# Patient Record
Sex: Male | Born: 1950 | Race: White | Hispanic: No | Marital: Married | Smoking: Never smoker
Health system: Southern US, Community
[De-identification: ages and names within clinical notes are randomized; demographics above are authoritative.]

## PROBLEM LIST (undated history)

## (undated) DIAGNOSIS — I10 Essential (primary) hypertension: Secondary | ICD-10-CM

## (undated) DIAGNOSIS — I739 Peripheral vascular disease, unspecified: Secondary | ICD-10-CM

## (undated) HISTORY — DX: Peripheral vascular disease, unspecified: I73.9

## (undated) HISTORY — PX: KNEE SURGERY: SHX244

---

## 1998-11-02 ENCOUNTER — Emergency Department (HOSPITAL_COMMUNITY): Admission: EM | Admit: 1998-11-02 | Discharge: 1998-11-02 | Payer: Self-pay | Admitting: Emergency Medicine

## 1998-11-14 ENCOUNTER — Emergency Department (HOSPITAL_COMMUNITY): Admission: EM | Admit: 1998-11-14 | Discharge: 1998-11-14 | Payer: Self-pay | Admitting: Emergency Medicine

## 1998-11-14 ENCOUNTER — Encounter: Payer: Self-pay | Admitting: Emergency Medicine

## 1999-06-16 ENCOUNTER — Encounter: Payer: Self-pay | Admitting: Emergency Medicine

## 1999-06-16 ENCOUNTER — Emergency Department (HOSPITAL_COMMUNITY): Admission: EM | Admit: 1999-06-16 | Discharge: 1999-06-16 | Payer: Self-pay | Admitting: Emergency Medicine

## 1999-06-23 ENCOUNTER — Emergency Department (HOSPITAL_COMMUNITY): Admission: EM | Admit: 1999-06-23 | Discharge: 1999-06-23 | Payer: Self-pay | Admitting: Internal Medicine

## 2000-01-02 ENCOUNTER — Emergency Department (HOSPITAL_COMMUNITY): Admission: EM | Admit: 2000-01-02 | Discharge: 2000-01-02 | Payer: Self-pay | Admitting: Emergency Medicine

## 2002-03-19 ENCOUNTER — Encounter: Payer: Self-pay | Admitting: Emergency Medicine

## 2002-03-19 ENCOUNTER — Emergency Department (HOSPITAL_COMMUNITY): Admission: EM | Admit: 2002-03-19 | Discharge: 2002-03-19 | Payer: Self-pay | Admitting: Emergency Medicine

## 2003-10-05 ENCOUNTER — Emergency Department (HOSPITAL_COMMUNITY): Admission: EM | Admit: 2003-10-05 | Discharge: 2003-10-05 | Payer: Self-pay | Admitting: Emergency Medicine

## 2003-10-09 ENCOUNTER — Emergency Department (HOSPITAL_COMMUNITY): Admission: AD | Admit: 2003-10-09 | Discharge: 2003-10-09 | Payer: Self-pay | Admitting: Family Medicine

## 2003-10-21 ENCOUNTER — Emergency Department (HOSPITAL_COMMUNITY): Admission: AD | Admit: 2003-10-21 | Discharge: 2003-10-21 | Payer: Self-pay | Admitting: Family Medicine

## 2004-02-21 ENCOUNTER — Emergency Department (HOSPITAL_COMMUNITY): Admission: EM | Admit: 2004-02-21 | Discharge: 2004-02-21 | Payer: Self-pay | Admitting: Internal Medicine

## 2004-06-27 ENCOUNTER — Emergency Department (HOSPITAL_COMMUNITY): Admission: EM | Admit: 2004-06-27 | Discharge: 2004-06-27 | Payer: Self-pay | Admitting: Emergency Medicine

## 2004-07-05 ENCOUNTER — Emergency Department (HOSPITAL_COMMUNITY): Admission: EM | Admit: 2004-07-05 | Discharge: 2004-07-05 | Payer: Self-pay | Admitting: Emergency Medicine

## 2004-11-22 ENCOUNTER — Emergency Department (HOSPITAL_COMMUNITY): Admission: EM | Admit: 2004-11-22 | Discharge: 2004-11-22 | Payer: Self-pay | Admitting: Emergency Medicine

## 2005-11-05 ENCOUNTER — Emergency Department (HOSPITAL_COMMUNITY): Admission: EM | Admit: 2005-11-05 | Discharge: 2005-11-05 | Payer: Self-pay | Admitting: Emergency Medicine

## 2011-06-02 ENCOUNTER — Emergency Department (HOSPITAL_COMMUNITY)
Admission: EM | Admit: 2011-06-02 | Discharge: 2011-06-02 | Discharge status: Against Medical Advice | Payer: Self-pay | Attending: Emergency Medicine | Admitting: Emergency Medicine

## 2011-06-02 DIAGNOSIS — Z79899 Other long term (current) drug therapy: Secondary | ICD-10-CM | POA: Insufficient documentation

## 2011-06-02 DIAGNOSIS — R29898 Other symptoms and signs involving the musculoskeletal system: Secondary | ICD-10-CM | POA: Insufficient documentation

## 2011-06-02 DIAGNOSIS — G8929 Other chronic pain: Secondary | ICD-10-CM | POA: Insufficient documentation

## 2011-06-02 DIAGNOSIS — M545 Low back pain, unspecified: Secondary | ICD-10-CM | POA: Insufficient documentation

## 2011-06-02 DIAGNOSIS — M79609 Pain in unspecified limb: Secondary | ICD-10-CM | POA: Insufficient documentation

## 2011-06-02 DIAGNOSIS — R209 Unspecified disturbances of skin sensation: Secondary | ICD-10-CM | POA: Insufficient documentation

## 2011-06-02 DIAGNOSIS — M5412 Radiculopathy, cervical region: Secondary | ICD-10-CM | POA: Insufficient documentation

## 2011-06-02 DIAGNOSIS — I10 Essential (primary) hypertension: Secondary | ICD-10-CM | POA: Insufficient documentation

## 2011-06-02 LAB — CBC
Hemoglobin: 15.2 g/dL (ref 13.0–17.0)
MCH: 32.7 pg (ref 26.0–34.0)
MCV: 91.8 fL (ref 78.0–100.0)
RBC: 4.65 MIL/uL (ref 4.22–5.81)

## 2011-06-02 LAB — DIFFERENTIAL
Lymphs Abs: 3.9 10*3/uL (ref 0.7–4.0)
Monocytes Relative: 7 % (ref 3–12)
Neutro Abs: 5.1 10*3/uL (ref 1.7–7.7)
Neutrophils Relative %: 52 % (ref 43–77)

## 2011-06-02 LAB — POCT I-STAT, CHEM 8
Chloride: 105 mEq/L (ref 96–112)
Creatinine, Ser: 0.8 mg/dL (ref 0.50–1.35)
Glucose, Bld: 110 mg/dL — ABNORMAL HIGH (ref 70–99)
Potassium: 3.6 mEq/L (ref 3.5–5.1)

## 2013-12-31 ENCOUNTER — Encounter (HOSPITAL_COMMUNITY): Payer: Self-pay | Admitting: Emergency Medicine

## 2013-12-31 ENCOUNTER — Emergency Department (HOSPITAL_COMMUNITY): Payer: BC Managed Care – PPO

## 2013-12-31 ENCOUNTER — Emergency Department (HOSPITAL_COMMUNITY)
Admission: EM | Admit: 2013-12-31 | Discharge: 2014-01-01 | Disposition: A | Discharge status: Home / Self Care | Payer: BC Managed Care – PPO | Attending: Emergency Medicine | Admitting: Emergency Medicine

## 2013-12-31 DIAGNOSIS — R269 Unspecified abnormalities of gait and mobility: Secondary | ICD-10-CM | POA: Insufficient documentation

## 2013-12-31 DIAGNOSIS — R4789 Other speech disturbances: Secondary | ICD-10-CM | POA: Insufficient documentation

## 2013-12-31 DIAGNOSIS — R4182 Altered mental status, unspecified: Secondary | ICD-10-CM | POA: Insufficient documentation

## 2013-12-31 DIAGNOSIS — N179 Acute kidney failure, unspecified: Secondary | ICD-10-CM | POA: Insufficient documentation

## 2013-12-31 LAB — CBC WITH DIFFERENTIAL/PLATELET
Basophils Absolute: 0 10*3/uL (ref 0.0–0.1)
Basophils Relative: 0 % (ref 0–1)
EOS ABS: 0.1 10*3/uL (ref 0.0–0.7)
Eosinophils Relative: 1 % (ref 0–5)
HCT: 33.5 % — ABNORMAL LOW (ref 39.0–52.0)
HEMOGLOBIN: 11.6 g/dL — AB (ref 13.0–17.0)
LYMPHS ABS: 1.2 10*3/uL (ref 0.7–4.0)
Lymphocytes Relative: 13 % (ref 12–46)
MCH: 34 pg (ref 26.0–34.0)
MCHC: 34.6 g/dL (ref 30.0–36.0)
MCV: 98.2 fL (ref 78.0–100.0)
MONOS PCT: 9 % (ref 3–12)
Monocytes Absolute: 0.9 10*3/uL (ref 0.1–1.0)
NEUTROS ABS: 7.3 10*3/uL (ref 1.7–7.7)
NEUTROS PCT: 77 % (ref 43–77)
PLATELETS: 235 10*3/uL (ref 150–400)
RBC: 3.41 MIL/uL — AB (ref 4.22–5.81)
RDW: 12.8 % (ref 11.5–15.5)
WBC: 9.5 10*3/uL (ref 4.0–10.5)

## 2013-12-31 LAB — COMPREHENSIVE METABOLIC PANEL
ALBUMIN: 3.7 g/dL (ref 3.5–5.2)
ALK PHOS: 58 U/L (ref 39–117)
ALT: 88 U/L — AB (ref 0–53)
AST: 285 U/L — ABNORMAL HIGH (ref 0–37)
BILIRUBIN TOTAL: 0.4 mg/dL (ref 0.3–1.2)
BUN: 21 mg/dL (ref 6–23)
CHLORIDE: 102 meq/L (ref 96–112)
CO2: 26 mEq/L (ref 19–32)
Calcium: 9 mg/dL (ref 8.4–10.5)
Creatinine, Ser: 1.5 mg/dL — ABNORMAL HIGH (ref 0.50–1.35)
GFR calc non Af Amer: 48 mL/min — ABNORMAL LOW (ref >90)
GFR, EST AFRICAN AMERICAN: 56 mL/min — AB (ref >90)
GLUCOSE: 105 mg/dL — AB (ref 70–99)
POTASSIUM: 4.4 meq/L (ref 3.7–5.3)
SODIUM: 143 meq/L (ref 137–147)
TOTAL PROTEIN: 7.2 g/dL (ref 6.0–8.3)

## 2013-12-31 LAB — ETHANOL

## 2013-12-31 LAB — I-STAT CG4 LACTIC ACID, ED: LACTIC ACID, VENOUS: 2.36 mmol/L — AB (ref 0.5–2.2)

## 2013-12-31 LAB — I-STAT TROPONIN, ED: Troponin i, poc: 0.01 ng/mL (ref 0.00–0.08)

## 2013-12-31 LAB — AMMONIA: AMMONIA: 39 umol/L (ref 11–60)

## 2013-12-31 MED ORDER — SODIUM CHLORIDE 0.9 % IV BOLUS (SEPSIS)
1000.0000 mL | Freq: Once | INTRAVENOUS | Status: AC
  Administered 2014-01-01: 1000 mL via INTRAVENOUS

## 2013-12-31 NOTE — ED Provider Notes (Signed)
CSN: [id number]     Arrival date & time 12/31/13  2157 History   First MD Initiated Contact with Patient 12/31/13 2220     Chief Complaint  Patient presents with  . Back Pain     (Consider location/radiation/quality/duration/timing/severity/associated sxs/prior Treatment) HPI Comments: Patient is a 63 year old male with history of low back pain who presents today from the EMS. EMS reports that they were initially called out for back pain, but when they arrived the patient had no complaints. He could not answer complex questions. He was oriented to person a date, but not to place. He was pale and diaphoretic upon arrival. For me he states that he feels fine. He reports that he was feeling "funny" when he was brought in, but now feels better. He reports that he is here because "he didn't pay his hotel bill".  He thinks he is feeling better because he is in a hospital. He believes he is in [employer]. He is oriented to person, but not to situation, time, or place. He is able to tell me who the President of the [place] is. He continues to repeat he takes Norco for pain, but did not take any prior to arrival. He is unable to give me any useful information.  On reevaluation of the patient he is able to tell me that EMS was called because he was having trouble sitting up. The "people at the hotel" called EMS because they "didn't want someone has to pick him up at 3 AM". He is now oriented to person, place, and situation.   The history is provided by the patient. No language interpreter was used.    History reviewed. No pertinent past medical history. History reviewed. No pertinent past surgical history. No family history on file. History  Substance Use Topics  . Smoking status: Never Smoker   . Smokeless tobacco: Never Used  . Alcohol Use: No     Comment: Denies    Review of Systems  Unable to perform ROS: Mental status change      Allergies  Review of patient's allergies indicates  no known allergies.  Home Medications   Current Outpatient Rx  Name  Route  Sig  Dispense  Refill  . HYDROcodone-acetaminophen (NORCO) 10-325 MG per tablet   Oral   Take 1 tablet by mouth every 6 (six) hours as needed for moderate pain.          BP 105/83  Pulse 92  Temp(Src) 98.7 F (37.1 C) (Oral)  Resp 16  SpO2 97% Physical Exam  Nursing note and vitals reviewed. Constitutional: He appears well-developed and well-nourished. No distress.  Disheveled.   HENT:  Head: Normocephalic and atraumatic.  Right Ear: External ear normal.  Left Ear: External ear normal.  Nose: Nose normal.  Eyes: Conjunctivae and EOM are normal. Pupils are equal, round, and reactive to light.  Pinpoint pupils, but equal and reactive.   Neck: Normal range of motion. No tracheal deviation present.  No nuchal rigidity or meningeal signs  Cardiovascular: Normal rate, regular rhythm, normal heart sounds, intact distal pulses and normal pulses.   Capillary refill < 3 seconds in all toes.   Pulmonary/Chest: Effort normal and breath sounds normal. No stridor.  Abdominal: Soft. He exhibits no distension. There is no tenderness.  Musculoskeletal: Normal range of motion.  Moves all 5 extremities  Neurological: He is alert. He has normal strength. Coordination normal.  Patient is oriented to person. Grip strength 5/5 bilaterally. Strength in  all extremities 5/5 bilaterally. Gait is shuffled. Finger nose finger normal.   Skin: Skin is warm and dry. He is not diaphoretic.  Psychiatric: He has a normal mood and affect. His speech is slurred. He is slowed.    ED Course  Procedures (including critical care time) Labs Review Labs Reviewed  CBC WITH DIFFERENTIAL - Abnormal; Notable for the following:    RBC 3.41 (*)    Hemoglobin 11.6 (*)    HCT 33.5 (*)    All other components within normal limits  COMPREHENSIVE METABOLIC PANEL - Abnormal; Notable for the following:    Glucose, Bld 105 (*)    Creatinine,  Ser 1.50 (*)    AST 285 (*)    ALT 88 (*)    GFR calc non Af Amer 48 (*)    GFR calc Af Amer 56 (*)    All other components within normal limits  URINALYSIS, ROUTINE W REFLEX MICROSCOPIC - Abnormal; Notable for the following:    APPearance CLOUDY (*)    Hgb urine dipstick LARGE (*)    Bilirubin Urine SMALL (*)    Ketones, ur 15 (*)    Protein, ur 100 (*)    All other components within normal limits  URINE RAPID DRUG SCREEN (HOSP PERFORMED) - Abnormal; Notable for the following:    Opiates POSITIVE (*)    Benzodiazepines POSITIVE (*)    All other components within normal limits  URINE MICROSCOPIC-ADD ON - Abnormal; Notable for the following:    Squamous Epithelial / LPF FEW (*)    Casts GRANULAR CAST (*)    All other components within normal limits  I-STAT CG4 LACTIC ACID, ED - Abnormal; Notable for the following:    Lactic Acid, Venous 2.36 (*)    All other components within normal limits  URINE CULTURE  ETHANOL  AMMONIA  I-STAT TROPOININ, ED   Imaging Review Ct Head Wo Contrast  01/01/2014   CLINICAL DATA:  Altered mental status  EXAM: CT HEAD WITHOUT CONTRAST  TECHNIQUE: Contiguous axial images were obtained from the base of the skull through the vertex without intravenous contrast.  COMPARISON:  None.  FINDINGS: Negative for acute intracranial hemorrhage, acute infarction, mass, mass effect, hydrocephalus or midline shift. Gray-white differentiation is preserved throughout. No acute soft tissue or calvarial abnormality. The globes and orbits are symmetric and unremarkable. Normal aeration of the mastoid air cells and visualized paranasal sinuses.  IMPRESSION: Negative head CT.   Electronically Signed   By: Malachy MoanHeath  McCullough M.D.   On: 01/01/2014 00:31   Dg Chest Port 1 View  12/31/2013   CLINICAL DATA:  SOB  EXAM: PORTABLE CHEST - 1 VIEW  COMPARISON:  None.  FINDINGS: Low lung volumes. Cardiac silhouette within the upper limits normal. Increased density left lung base. Blunting  left costophrenic angle. Osseous structures unremarkable.  IMPRESSION: Atelectasis versus infiltrate left lung base small and possible small left effusion.   Electronically Signed   By: Salome HolmesHector  Cooper M.D.   On: 12/31/2013 23:03     EKG Interpretation   Date/Time:  Wednesday December 31 2013 22:30:10 EDT Ventricular Rate:  104 PR Interval:  124 QRS Duration: 89 QT Interval:  321 QTC Calculation: 422 R Axis:   42 Text Interpretation:  Sinus tachycardia Low voltage, precordial leads No  significant change was found Confirmed by [facility] [email or web address]  MD, TREY ([id number]) on  01/01/2014 12:06:29 AM      MDM   Final diagnoses:  Altered mental status  Acute renal  failure   Patient presents to ED from a hotel by EMS. Initial complaint was back pain, but patient appeared altered initially. AMS work up was done which shows mild renal failure and a possible infiltrate of CXR. Patient's UDS was positive for opiates and benzos. Patient improved significantly through the duration of his ED stay. I felt as though his speech continued to be slurred and his gait continued to be shuffled. I was planning on admitting patient for further workup. The patient adamantly refused. He was oriented to person, place, and situation. He is competent at this point to make his own decisions and left AMA. Risks of doing so were discussed including worsening morbidity and death. He was encouraged to return to the ED if he changed his mind. Dr. Judd Lien evaluated patient and agrees with plan. Patient / Family / Caregiver informed of clinical course, understand medical decision-making process, and agree with plan.   Mora Bellman, PA-C 01/02/14 1159

## 2013-12-31 NOTE — ED Notes (Signed)
Assisted patient with urination attempt. Patient was not able to urinate for urinalysis.

## 2013-12-31 NOTE — ED Notes (Signed)
EMS called out for back pain, however upon arrival, patient had no complaints and could not answer complex questions. Pt is alert to name and date, but did not know where he was. EMs report patient was pale and diaphoretic. Pt states he took 2 norco for back spasms. Denies any ETOH or drugs. Pupils are pinpoint. CBG 157. HR 95 and BP 102/64. Patient has no complaints at this time.

## 2013-12-31 NOTE — ED Notes (Signed)
Patient 89% on RA patient place on 2L via [place]

## 2014-01-01 ENCOUNTER — Emergency Department (HOSPITAL_COMMUNITY): Payer: BC Managed Care – PPO

## 2014-01-01 LAB — RAPID URINE DRUG SCREEN, HOSP PERFORMED
Amphetamines: NOT DETECTED
BARBITURATES: NOT DETECTED
Benzodiazepines: POSITIVE — AB
Cocaine: NOT DETECTED
Opiates: POSITIVE — AB
Tetrahydrocannabinol: NOT DETECTED

## 2014-01-01 LAB — URINE MICROSCOPIC-ADD ON

## 2014-01-01 LAB — URINALYSIS, ROUTINE W REFLEX MICROSCOPIC
Glucose, UA: NEGATIVE mg/dL
KETONES UR: 15 mg/dL — AB
LEUKOCYTES UA: NEGATIVE
NITRITE: NEGATIVE
PROTEIN: 100 mg/dL — AB
Specific Gravity, Urine: 1.029 (ref 1.005–1.030)
UROBILINOGEN UA: 0.2 mg/dL (ref 0.0–1.0)
pH: 5 (ref 5.0–8.0)

## 2014-01-01 NOTE — Discharge Instructions (Signed)
Today you were seen at the Emergency Department. It is unclear what happened that brought you in to the hospital, but your blood work today is showing that you have some kidney failure and elevated liver enzymes. You left before your workup was complete. Risks of this include worsening quality of life, infection, permanent disability, and death. Please return to the ED if you change your mind.    Altered Mental Status Altered mental status most often refers to an abnormal change in your responsiveness and awareness. It can affect your speech, thought, mobility, memory, attention span, or alertness. It can range from slight confusion to complete unresponsiveness (coma). Altered mental status can be a sign of a serious underlying medical condition. Rapid evaluation and medical treatment is necessary for patients having an altered mental status. CAUSES   Low blood sugar (hypoglycemia) or diabetes.  Severe loss of body fluids (dehydration) or a body salt (electrolyte) imbalance.  A stroke or other neurologic problem, such as dementia or delirium.  A head injury or tumor.  A drug or alcohol overdose.  Exposure to toxins or poisons.  Depression, anxiety, and stress.  A low oxygen level (hypoxia).  An infection.  Blood loss.  Twitching or shaking (seizure).  Heart problems, such as heart attack or heart rhythm problems (arrhythmias).  A body temperature that is too low or too high (hypothermia or hyperthermia). DIAGNOSIS  A diagnosis is based on your history, symptoms, physical and neurologic examinations, and diagnostic tests. Diagnostic tests may include:  Measurement of your blood pressure, pulse, breathing, and oxygen levels (vital signs).  Blood tests.  Urine tests.  X-ray exams.  A computerized magnetic scan (magnetic resonance imaging, MRI).  A computerized X-ray scan (computed tomography, CT scan). TREATMENT  Treatment will depend on the cause. Treatment may  include:  Management of an underlying medical or mental health condition.  Critical care or support in the hospital. HOME CARE INSTRUCTIONS   Only take over-the-counter or prescription medicines for pain, discomfort, or fever as directed by your caregiver.  Manage underlying conditions as directed by your caregiver.  Eat a healthy, well-balanced diet to maintain strength.  Join a support group or prevention program to cope with the condition or trauma that caused the altered mental status. Ask your caregiver to help choose a program that works for you.  Follow up with your caregiver for further examination, therapy, or testing as directed. SEEK MEDICAL CARE IF:   You feel unwell or have chills.  You or your family notice a change in your behavior or your alertness.  You have trouble following your caregiver's treatment plan.  You have questions or concerns. SEEK IMMEDIATE MEDICAL CARE IF:   You have a rapid heartbeat or have chest pain.  You have difficulty breathing.  You have a fever.  You have a headache with a stiff neck.  You cough up blood.  You have blood in your urine or stool.  You have severe agitation or confusion. MAKE SURE YOU:   Understand these instructions.  Will watch your condition.  Will get help right away if you are not doing well or get worse. Document Released: 03/08/2010 Document Revised: 12/11/2011 Document Reviewed: 03/08/2010 [facility] Patient Information 2014 [place], [place].   Kidney Failure Kidney failure happens when the kidneys cannot remove waste and excess fluid that naturally builds up in your blood after your body breaks down food. This leads to a dangerous buildup of waste products and fluid in the blood. HOME CARE  Follow your diet as told by your doctor.  Take all medicines as told by your doctor.  Keep all of your dialysis appointments. Call if you are unable to keep an appointment. GET HELP RIGHT AWAY IF:   You make  a lot more or very little pee (urine).  Your face or ankles puff up (swell).  You develop shortness of breath.  You develop weakness, feel tired, or you do not feel hungry (appetite loss).  You feel poorly for no known reason. MAKE SURE YOU:   Understand these instructions.  Will watch your condition.  Will get help right away if you are not doing well or get worse. Document Released: 12/13/2009 Document Revised: 12/11/2011 Document Reviewed: 01/19/2010 [facility] Patient Information 2014 [place].   Emergency Department Resource Guide 1) Find a Doctor and Pay Out of Pocket Although you won't have to find out who is covered by your insurance plan, it is a good idea to ask around and get recommendations. You will then need to call the office and see if the doctor you have chosen will accept you as a new patient and what types of options they offer for patients who are self-pay. Some doctors offer discounts or will set up payment plans for their patients who do not have insurance, but you will need to ask so you aren't surprised when you get to your appointment.  2) Contact Your Local Health Department Not all health departments have doctors that can see patients for sick visits, but many do, so it is worth a call to see if yours does. If you don't know where your local health department is, you can check in your phone book. The CDC also has a tool to help you locate your state's health department, and many state websites also have listings of all of their local health departments.  3) Find a Walk-in Clinic If your illness is not likely to be very severe or complicated, you may want to try a walk in clinic. These are popping up all over the country in pharmacies, drugstores, and shopping centers. They're usually staffed by nurse practitioners or physician assistants that have been trained to treat common illnesses and complaints. They're usually fairly quick and inexpensive. However,  if you have serious medical issues or chronic medical problems, these are probably not your best option.  No Primary Care Doctor: - Call Health Connect at  [phone] - they can help you locate a primary care doctor that  accepts your insurance, provides certain services, etc. - Physician Referral Service- [phone]  Chronic Pain Problems: Organization         Address  Phone   Notes  [facility]  [phone] Patients need to be referred by their primary care doctor.   Medication Assistance: Organization         Address  Phone   Notes  [facility] Medication [facility] [address] [place], [place] [phone] --Must be a resident of [place] -- Must have NO insurance coverage whatsoever (no Medicaid/ Medicare, etc.) -- The pt. MUST have a primary care doctor that directs their care regularly and follows them in the community   Med[facility]ist  [phone]   [employer]  [phone]    Agencies that provide inexpensive medical care: Organization         Address  Phone   Notes  [facility]  [phone]   [facility]    [phone]   [facility], [place] [address] [phone]   [place] [address] [place] [phone]   Planned Parenthood    [phone]   [facility]    [phone]   [place] [address], [place] Phone:  [phone], Fax:  [phone] Hours of Operation:  9 am - 6 pm, M-F.  Also accepts Medicaid/Medicare and self-pay.  [facility] for [place], [address], [place] Phone: [phone], Fax: [phone]. Hours of Operation:  8:30 am - 5:30 pm, M-F.  Also accepts Medicaid and self-pay.  [facility] [place] [address], [place] Phone: [phone]   [place], [place], [place] [phone], Ext. [phone] Mondays & Thursdays: 7-9 AM.  First 15 patients are seen on a first come, first serve basis.    [place]ie Providers:  Organization         Address  Phone   Notes  [facility] [address], [place] [phone] Also accepts self-pay patients.  [facility] [address], [place]  [phone]   [place], [address], [place] [phone]   [facility] [address] [place] [phone]   Lucianne Lei [address], [place]   [phone] Only accepts [place] Access [place] patients after they have their name applied to their card.   Self-Pay (no insurance) in [facility]:  Organization         Address  Phone   Notes  Sickle Cell Patients, [facility] [place] [phone]   [facility] [place] [phone]   [facility] [place]  [place], [place], [place] [phone]   [facility]/Dr. Osei-Bonsu  [address] [place] or [address], [place] [phone] Phone number for both [place] and Trail locations is the same.  [facility] [address], [place] [phone]   [facility] [address], [place] or [address] [phone]   [facility] [address] [place] [phone], phone; [phone], fax Sees patients 1st and 3rd Saturday of every month.  Must not qualify for public or private insurance (i.e. Medicaid, Medicare, [place] Health Choice, Veterans' Benefits)  Household income should be no more than 200% of the poverty level The clinic cannot treat you if you are pregnant or think you are  pregnant  Sexually transmitted diseases are not treated at the clinic.    Dental Care: Organization         Address  Phone  Notes  [facility] Department of [facility] [place] [phone] Accepts children up to age 1 who are enrolled in [place] or [place]; pregnant women with a Medicaid card; and children who have applied for Medicaid or [place] Health Choice, but were declined, whose parents can pay a reduced fee at time of service.  [facility] of [facility]  [address], [employer] (  [phone] Accepts children up to age 23 who are enrolled in Medicaid or [place]; pregnant women with a Medicaid card; and children who have applied for Medicaid or [place] Health Choice, but were declined, whose parents can pay a reduced fee at time of service.  [facility]  [place] [phone] Patients are seen by appointment only. Walk-ins are not accepted. [place] will see patients 59 years of age and older. Monday - Tuesday (8am-5pm) Most Wednesdays (8:30-5pm) $30 per visit, cash only  [facility] Adult Dental Access PROGRAM  [address], [facility] [phone] Patients are seen by appointment only. Walk-ins are not accepted. [place] will see patients 65 years of age and older. One Wednesday Evening (Monthly: Volunteer Based).  $30 per visit, cash only  [facility]  [phone] for adults; Children under age 42, call Graduate Pediatric Dentistry at [phone]. Children aged 10-14, please call [phone] to request a pediatric application.  Dental services are provided in all areas of dental care including fillings, crowns and bridges, complete and partial dentures, implants, gum treatment, root canals, and extractions. Preventive care is also provided. Treatment is provided to both adults and  children. Patients are selected via a lottery and there is often a waiting list.   [facility] [address] [place]  [phone] [email or web address]   [facility] [address] [place], [place] [phone], Ext. [phone] Second and Fourth Thursday of each month, opens at 6:30 AM; Clinic ends at 9 AM.  Patients are seen on a first-come first-served basis, and a limited number are seen during each clinic.   [facility]  [address] [place]er [place], [place] [phone]   Eligibility Requirements You must have lived in [place], [place], or [place] counties for at least the last three months.   You cannot be eligible for state or federal sponsored [employer], including [employer], [place], or [employer].   You generally cannot be eligible for healthcare insurance through your employer.    How to apply: Eligibility screenings are held every Tuesday and Wednesday afternoon from 1:00 pm until 4:00 pm. You do not need an appointment for the interview!  [facility] [address] [place], [place]   [place]  [place] Department  [place]  [phone]    Behavioral Health Resources in the Community: Intensive Outpatient Programs Organization         Address  Phone  Notes  [facility] [place] [facility] [address] [place], [place] [phone]   [facility] Outpatient [address], [place], [place]   [facility]: [facility] [address] [place], [place]   [place] [address],  [place], [place] or [phone]   Substance Abuse Resources Organization         Address  Phone  Notes  [facility]  [phone]   [place]  [phone]   [facility] [place]    Chinita Pester  (714) [phone]   Residential & Outpatient Substance Abuse Program  [phone]   Psychological Services Organization         Address  Phone  Notes  [place]  [phone]  [facility] [address], [place] 1-800-853-5163 or [phone]   ° °Mobile Crisis Teams °Organization         Address  Phone  Notes  °[facility], Mobile Crisis Care Unit  1-877-626-1772   °Assertive °Psychotherapeutic Services ° [address] [place], [place] [phone]   °Sharon DeEsch [address] °[place] [phone]   ° °Self-Help/Support Groups °Organization         Address  Phone             Notes  °[facility] [place] - variety of support groups  336- [phone] Call for more information  °Narcotics Anonymous (NA), [facility] [address], °[place]  2 meetings at this location  ° °Residential Treatment Programs °Organization         Address  Phone  Notes  °[facility] [address],    °[place]  1-866-801-8205   °[facility] ° [address], [place], [place] [phone]   °[facility] [address], [place] [phone] Admissions: 8am-3pm M-F  °[facility] [address]    °[place], [place] [phone]   °[facility] [address], [place], [place] [phone]   °[facility] [address]  °[place], [place] [phone]   °[facility] - Intensive Outpatient [address], [place], [place] [phone]   °[facility] ([facility] [address]  °[place], [place] 1-877-615-2722 or 336-784-9470   °[facility] ([facility]) [address] [place], [place] 336-227-7417 Accepts Medicaid  °[facility] [address]  °[place] 1-800-659-3381 Substance Abuse/Addiction Treatment  ° °[place]  Behavioral Health Resources °Organization         Address  Phone  Notes  °[facility]  (888) 581-9988   °Julie Brannon, PhD [address] [place], [place]   (336) 349-5553 or (336) 951-0000   ° Behavioral   [address] °[place], [place] (336) 349-4454   °[facility] [address], [place], [place] (336) [phone] Insurance/Medicaid/sponsorship through [facility]  °[facility] [address]                                    [place], [place] (336) [phone] Therapy/tele-psych/case  °[facility] [address]  ° [place], [place] (336) [phone]    °Dr. Arfeen  (336) [phone]   °[facility] [place]  [employer] [place] [facility] 1) [address], [place] °2) [address], [place] °3)  [address], [place] (336) 349-3220 °(336) 342-7768 ° °(336) [phone]   °[place] Child Abuse Hotline (336) 342-1394 or (336) 342-3537 (After Hours)    ° ° ° °

## 2014-01-01 NOTE — ED Notes (Signed)
RN communicated risks of leaving AMA to patient. Patient leaving in cab. Patient was cleared to leave by MD [facility]

## 2014-01-01 NOTE — ED Notes (Signed)
Patient pulled Iv out and got dressed and was walking out. RN stopped patient and explained risks of leaving AMA. Pt communicated understanding and wanted to leave. RN had concerns about patients mental status. RN spoke to Applied MaterialsPA-C Hannah Merrel about patients neuro status. Patient is alert and answering questions appropriately.

## 2014-01-01 NOTE — ED Notes (Signed)
MD at bedside. 

## 2014-01-01 NOTE — ED Notes (Signed)
Per MD Delo, patient is clear to leave.

## 2014-01-01 NOTE — ED Notes (Signed)
RN walked out to waiting room with patient. RN still had concerns about patient's mental status. RN asked patient to write his name and number, patient wrote illegible words and " [phone] ". RN called number listed in patients chart and got own patients voicemail, and patient sounded drastically different on voicemail. RN convinced patient to come back to his room and have MD evaluate patient.

## 2014-01-02 LAB — URINE CULTURE
COLONY COUNT: NO GROWTH
CULTURE: NO GROWTH

## 2014-01-05 NOTE — ED Provider Notes (Signed)
Medical screening examination/treatment/procedure(s) were performed by non-physician practitioner and as supervising physician I was immediately available for consultation/collaboration.   EKG Interpretation   Date/Time:  Wednesday December 31 2013 22:30:10 EDT Ventricular Rate:  104 PR Interval:  124 QRS Duration: 89 QT Interval:  321 QTC Calculation: 422 R Axis:   42 Text Interpretation:  Sinus tachycardia Low voltage, precordial leads No  significant change was found Confirmed by [facility]  MD, TREY ([id number]) on  01/01/2014 12:06:29 AM       Geoffery Lyonsouglas Eshika Reckart, MD 01/05/14 2312

## 2014-11-13 IMAGING — CT CT HEAD W/O CM
1 series · 16 of 30 positions shown, 20 images · non-contrast
Comparison: None.

CLINICAL DATA: Altered mental status

EXAM:
CT HEAD WITHOUT CONTRAST
TECHNIQUE: Contiguous axial images were obtained from the base of the skull
through the vertex without intravenous contrast.

[Series 2: head 5.0 h30s · axial · 0.48mm/px · z∈[-185,-30]mm · 16 of 35 slices shown, 20 images]
[im 2/35  brain]
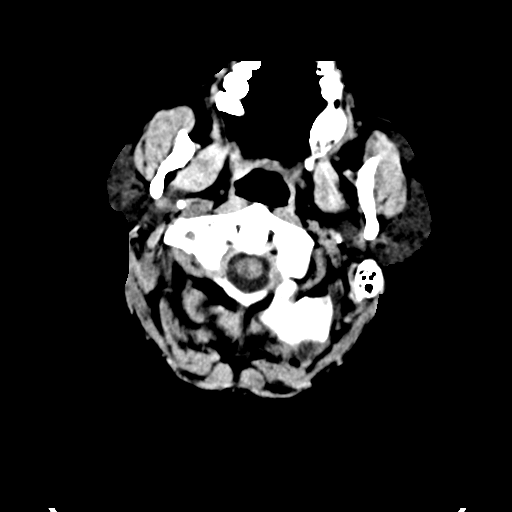
[im 2/35  bone]
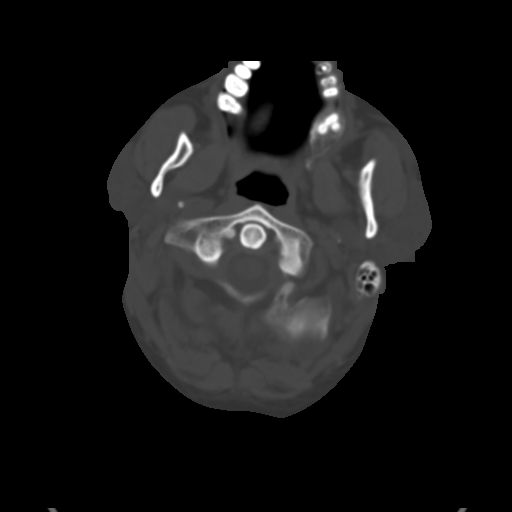
[im 4/35  brain]
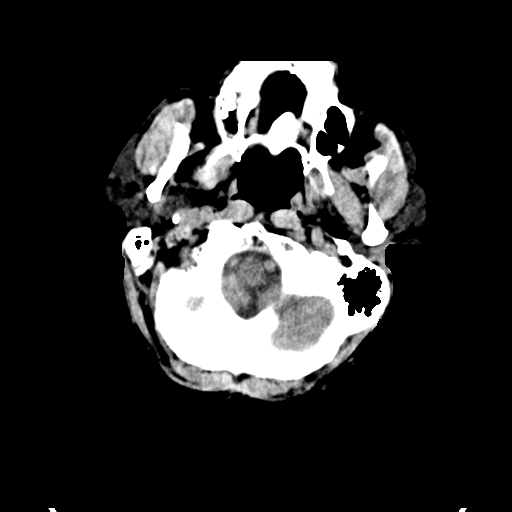
[im 6/35  brain]
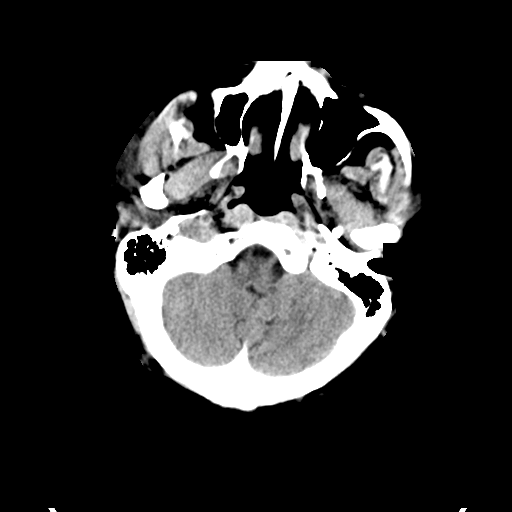
[im 9/35  brain]
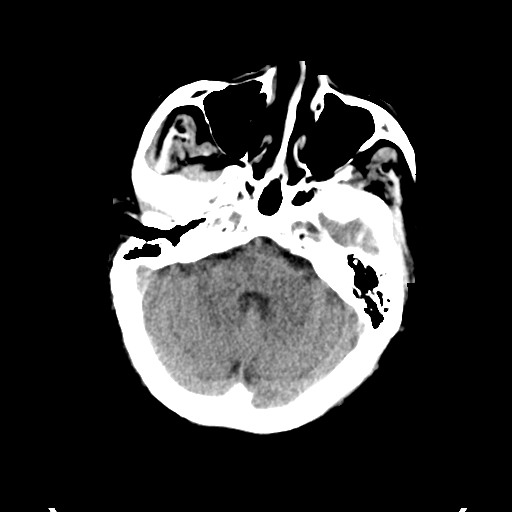
[im 10/35  brain]
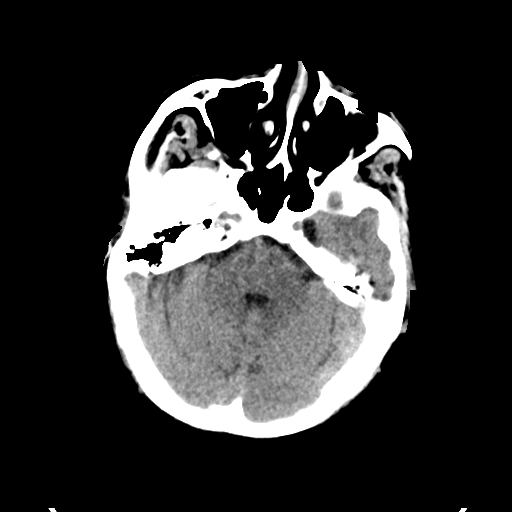
[im 10/35  bone]
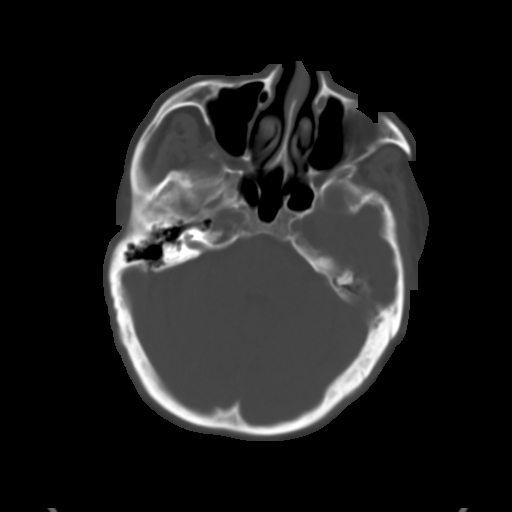
[im 12/35  brain]
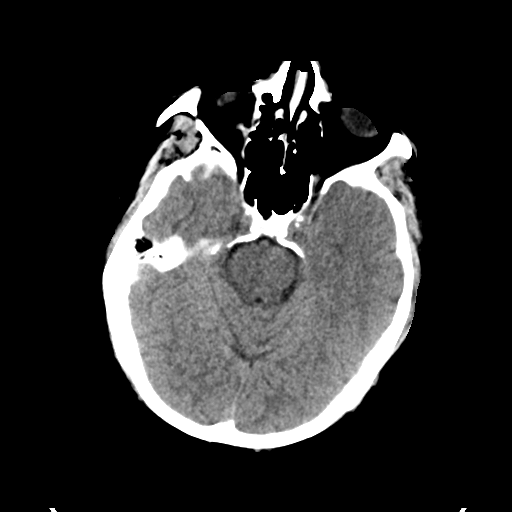
[im 15/35  brain]
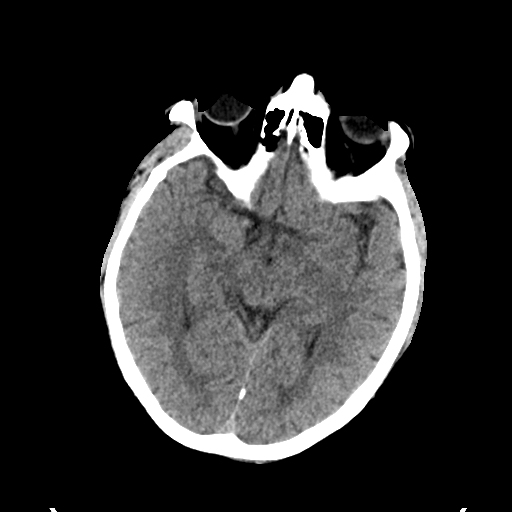
[im 17/35  brain]
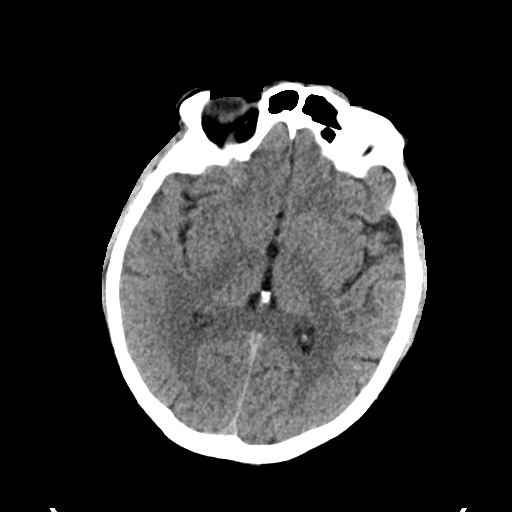
[im 18/35  brain]
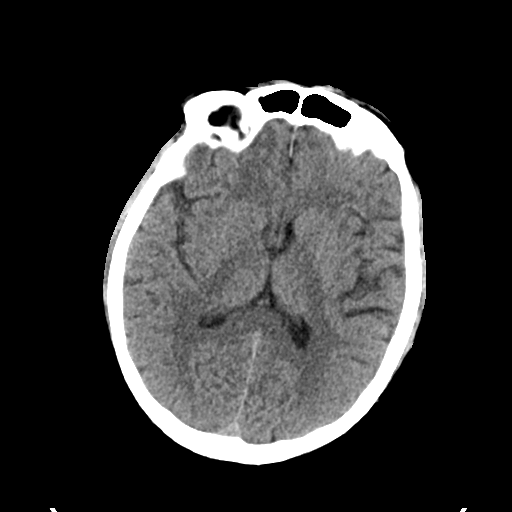
[im 18/35  bone]
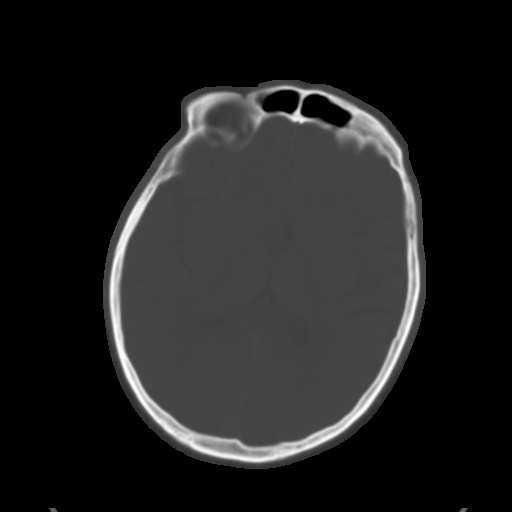
[im 20/35  brain]
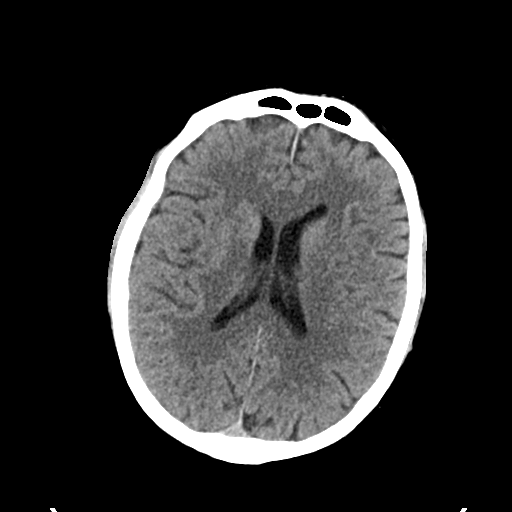
[im 23/35  brain]
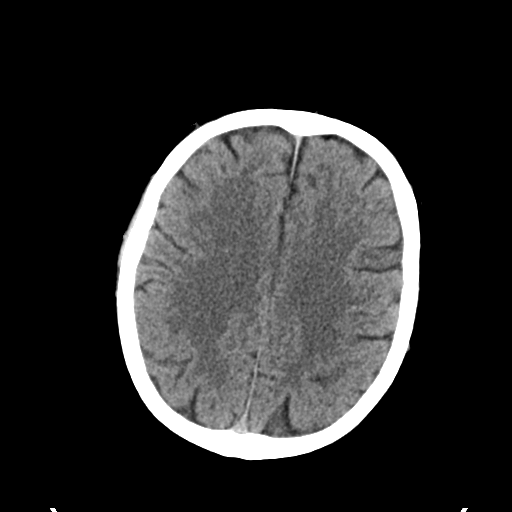
[im 25/35  brain]
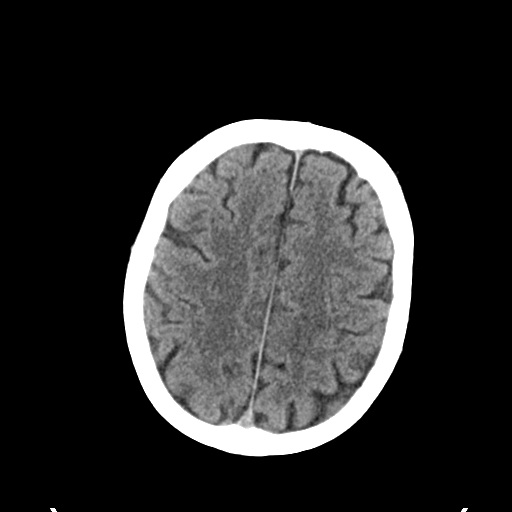
[im 26/35  brain]
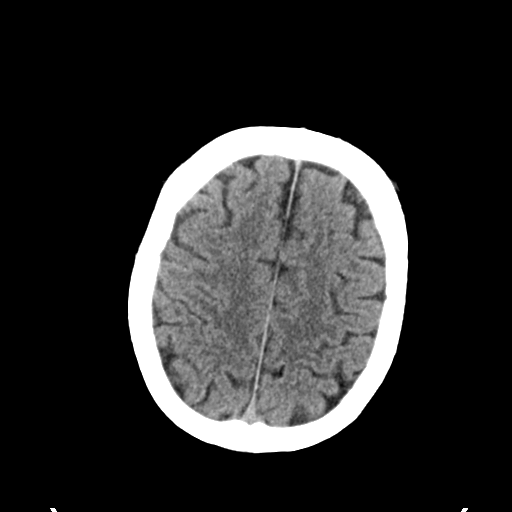
[im 26/35  bone]
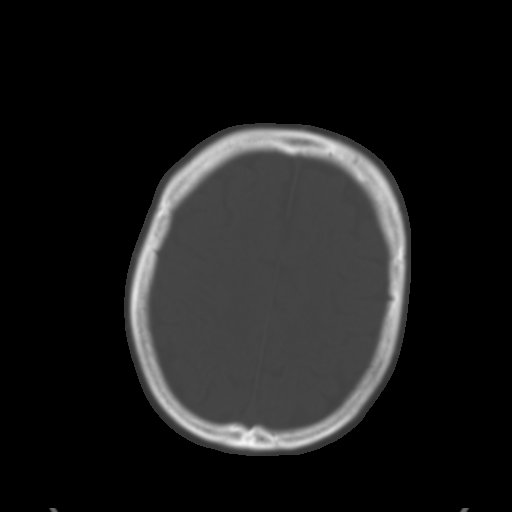
[im 29/35  brain]
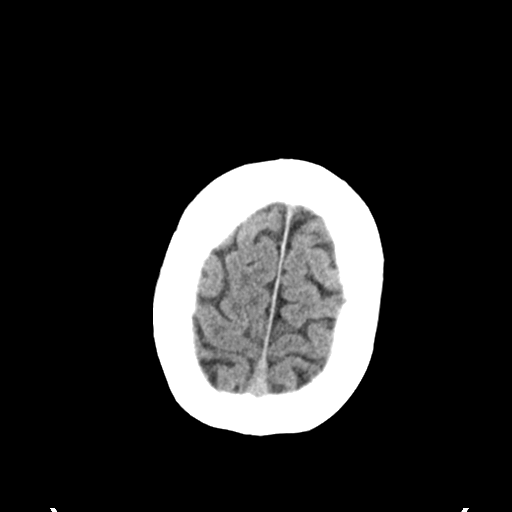
[im 31/35  brain]
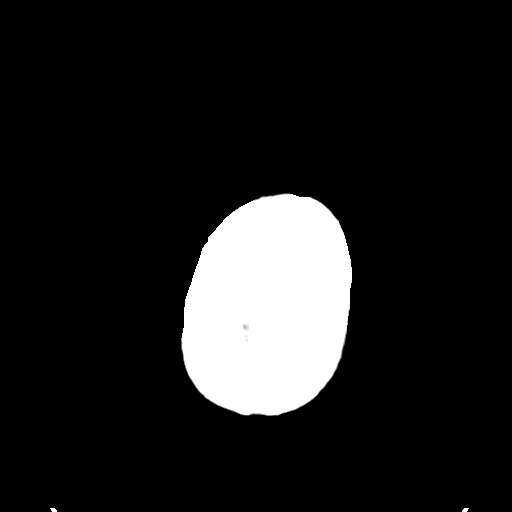
[im 33/35  brain]
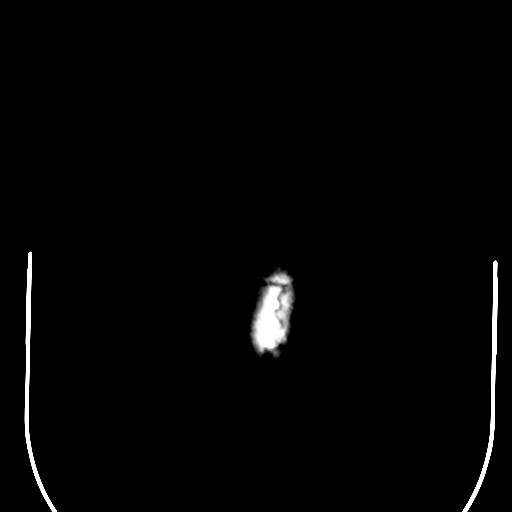

[16 of 30 positions shown; findings below may reference images not displayed]

FINDINGS: Negative for acute intracranial hemorrhage, acute infarction, mass,
mass effect, hydrocephalus or midline shift. Gray-white
differentiation is preserved throughout. No acute soft tissue or
calvarial abnormality. The globes and orbits are symmetric and
unremarkable. Normal aeration of the mastoid air cells and
visualized paranasal sinuses.
IMPRESSION: Negative head CT.

## 2021-09-30 DIAGNOSIS — I10 Essential (primary) hypertension: Secondary | ICD-10-CM | POA: Insufficient documentation

## 2021-09-30 DIAGNOSIS — E785 Hyperlipidemia, unspecified: Secondary | ICD-10-CM | POA: Insufficient documentation

## 2021-10-27 DIAGNOSIS — I10 Essential (primary) hypertension: Secondary | ICD-10-CM | POA: Insufficient documentation

## 2022-07-25 ENCOUNTER — Other Ambulatory Visit (HOSPITAL_COMMUNITY): Payer: Self-pay

## 2022-07-25 MED ORDER — HYDROCODONE-ACETAMINOPHEN 10-325 MG PO TABS: 1.0000 | ORAL_TABLET | Freq: Two times a day (BID) | ORAL | 0 refills | Status: DC | PRN

## 2022-07-25 MED ORDER — HYDROCODONE-ACETAMINOPHEN 10-325 MG PO TABS
1.0000 | ORAL_TABLET | Freq: Two times a day (BID) | ORAL | 0 refills | Status: DC | PRN
  Filled 2022-07-25: qty 60, 30d supply, fill #0

## 2022-08-23 ENCOUNTER — Other Ambulatory Visit (HOSPITAL_COMMUNITY): Payer: Self-pay

## 2022-08-23 MED ORDER — OXYCODONE-ACETAMINOPHEN 10-325 MG PO TABS
1.0000 | ORAL_TABLET | Freq: Two times a day (BID) | ORAL | 0 refills | Status: DC | PRN
  Filled 2022-08-23: qty 60, 30d supply, fill #0

## 2022-08-23 MED ORDER — ROSUVASTATIN CALCIUM 20 MG PO TABS: 20.0000 mg | ORAL_TABLET | Freq: Every day | ORAL | 3 refills | Status: DC

## 2022-08-23 MED ORDER — LISINOPRIL-HYDROCHLOROTHIAZIDE 10-12.5 MG PO TABS
1.0000 | ORAL_TABLET | Freq: Every day | ORAL | 3 refills | Status: DC
  Filled 2022-08-23: qty 90, 90d supply, fill #0

## 2022-08-23 MED ORDER — HYDROCODONE-ACETAMINOPHEN 10-325 MG PO TABS
1.0000 | ORAL_TABLET | Freq: Two times a day (BID) | ORAL | 0 refills | Status: DC | PRN
  Filled 2022-08-23: qty 60, 30d supply, fill #0

## 2022-09-05 ENCOUNTER — Other Ambulatory Visit (HOSPITAL_COMMUNITY): Payer: Self-pay

## 2022-09-20 ENCOUNTER — Other Ambulatory Visit (HOSPITAL_COMMUNITY): Payer: Self-pay

## 2022-09-20 MED ORDER — OXYCODONE-ACETAMINOPHEN 10-325 MG PO TABS
1.0000 | ORAL_TABLET | Freq: Two times a day (BID) | ORAL | 0 refills | Status: DC | PRN
  Filled 2022-09-20: qty 60, 30d supply, fill #0

## 2022-10-24 ENCOUNTER — Other Ambulatory Visit (HOSPITAL_COMMUNITY): Payer: Self-pay

## 2022-10-24 MED ORDER — OXYCODONE-ACETAMINOPHEN 10-325 MG PO TABS
1.0000 | ORAL_TABLET | Freq: Two times a day (BID) | ORAL | 0 refills | Status: DC
  Filled 2022-10-24: qty 60, 30d supply, fill #0

## 2022-12-22 ENCOUNTER — Other Ambulatory Visit (HOSPITAL_COMMUNITY): Payer: Self-pay

## 2022-12-22 MED ORDER — LISINOPRIL-HYDROCHLOROTHIAZIDE 10-12.5 MG PO TABS
1.0000 | ORAL_TABLET | Freq: Every day | ORAL | 3 refills | Status: AC
  Filled 2022-12-22: qty 90, 90d supply, fill #0
  Filled 2023-06-19: qty 30, 30d supply, fill #1

## 2022-12-22 MED ORDER — HYDROCODONE-ACETAMINOPHEN 10-325 MG PO TABS
1.0000 | ORAL_TABLET | Freq: Two times a day (BID) | ORAL | 0 refills | Status: DC | PRN
  Filled 2022-12-22: qty 60, 30d supply, fill #0

## 2023-01-03 ENCOUNTER — Encounter (HOSPITAL_COMMUNITY): Payer: Self-pay

## 2023-01-03 ENCOUNTER — Other Ambulatory Visit: Payer: Self-pay

## 2023-01-03 ENCOUNTER — Emergency Department (HOSPITAL_COMMUNITY): Payer: Medicare Other

## 2023-01-03 ENCOUNTER — Inpatient Hospital Stay (HOSPITAL_COMMUNITY)
Admission: EM | Admit: 2023-01-03 | Discharge: 2023-01-06 | DRG: 419 | Disposition: A | Discharge status: Home / Self Care | Payer: Medicare Other | Attending: General Surgery | Admitting: General Surgery

## 2023-01-03 DIAGNOSIS — Z79891 Long term (current) use of opiate analgesic: Secondary | ICD-10-CM

## 2023-01-03 DIAGNOSIS — R079 Chest pain, unspecified: Secondary | ICD-10-CM | POA: Diagnosis not present

## 2023-01-03 DIAGNOSIS — K810 Acute cholecystitis: Secondary | ICD-10-CM | POA: Diagnosis present

## 2023-01-03 DIAGNOSIS — Z79899 Other long term (current) drug therapy: Secondary | ICD-10-CM

## 2023-01-03 DIAGNOSIS — M549 Dorsalgia, unspecified: Secondary | ICD-10-CM | POA: Diagnosis present

## 2023-01-03 DIAGNOSIS — K8012 Calculus of gallbladder with acute and chronic cholecystitis without obstruction: Principal | ICD-10-CM | POA: Diagnosis present

## 2023-01-03 DIAGNOSIS — I10 Essential (primary) hypertension: Secondary | ICD-10-CM | POA: Diagnosis present

## 2023-01-03 DIAGNOSIS — I708 Atherosclerosis of other arteries: Secondary | ICD-10-CM | POA: Diagnosis present

## 2023-01-03 DIAGNOSIS — G8929 Other chronic pain: Secondary | ICD-10-CM | POA: Diagnosis present

## 2023-01-03 DIAGNOSIS — E785 Hyperlipidemia, unspecified: Secondary | ICD-10-CM | POA: Diagnosis present

## 2023-01-03 DIAGNOSIS — K819 Cholecystitis, unspecified: Principal | ICD-10-CM

## 2023-01-03 HISTORY — DX: Essential (primary) hypertension: I10

## 2023-01-03 LAB — MAGNESIUM: Magnesium: 1.9 mg/dL (ref 1.7–2.4)

## 2023-01-03 LAB — CBC
HCT: 44.3 % (ref 39.0–52.0)
Hemoglobin: 16 g/dL (ref 13.0–17.0)
MCH: 33.2 pg (ref 26.0–34.0)
MCHC: 36.1 g/dL — ABNORMAL HIGH (ref 30.0–36.0)
MCV: 91.9 fL (ref 80.0–100.0)
Platelets: 345 10*3/uL (ref 150–400)
RBC: 4.82 MIL/uL (ref 4.22–5.81)
RDW: 11.9 % (ref 11.5–15.5)
WBC: 13.1 10*3/uL — ABNORMAL HIGH (ref 4.0–10.5)
nRBC: 0 % (ref 0.0–0.2)

## 2023-01-03 LAB — BASIC METABOLIC PANEL
Anion gap: 14 (ref 5–15)
BUN: 17 mg/dL (ref 8–23)
CO2: 20 mmol/L — ABNORMAL LOW (ref 22–32)
Calcium: 10 mg/dL (ref 8.9–10.3)
Chloride: 102 mmol/L (ref 98–111)
Creatinine, Ser: 1.15 mg/dL (ref 0.61–1.24)
GFR, Estimated: >60 mL/min (ref >60)
Glucose, Bld: 183 mg/dL — ABNORMAL HIGH (ref 70–99)
Potassium: 4.2 mmol/L (ref 3.5–5.1)
Sodium: 136 mmol/L (ref 135–145)

## 2023-01-03 LAB — HEPATIC FUNCTION PANEL
ALT: 20 U/L (ref 0–44)
AST: 22 U/L (ref 15–41)
Albumin: 4.1 g/dL (ref 3.5–5.0)
Alkaline Phosphatase: 58 U/L (ref 38–126)
Bilirubin, Direct: 0.1 mg/dL (ref 0.0–0.2)
Indirect Bilirubin: 0.9 mg/dL (ref 0.3–0.9)
Total Bilirubin: 1 mg/dL (ref 0.3–1.2)
Total Protein: 7.9 g/dL (ref 6.5–8.1)

## 2023-01-03 LAB — I-STAT CHEM 8, ED
BUN: 19 mg/dL (ref 8–23)
Calcium, Ion: 1.18 mmol/L (ref 1.15–1.40)
Chloride: 104 mmol/L (ref 98–111)
Creatinine, Ser: 0.9 mg/dL (ref 0.61–1.24)
Glucose, Bld: 184 mg/dL — ABNORMAL HIGH (ref 70–99)
HCT: 45 % (ref 39.0–52.0)
Hemoglobin: 15.3 g/dL (ref 13.0–17.0)
Potassium: 4.3 mmol/L (ref 3.5–5.1)
Sodium: 138 mmol/L (ref 135–145)
TCO2: 22 mmol/L (ref 22–32)

## 2023-01-03 LAB — TROPONIN I (HIGH SENSITIVITY)
Troponin I (High Sensitivity): 11 ng/L (ref <18)
Troponin I (High Sensitivity): 17 ng/L (ref <18)

## 2023-01-03 LAB — LIPASE, BLOOD: Lipase: 31 U/L (ref 11–51)

## 2023-01-03 MED ORDER — METOCLOPRAMIDE HCL 5 MG/ML IJ SOLN: 10.0000 mg | Freq: Once | INTRAMUSCULAR | Status: DC

## 2023-01-03 MED ORDER — METHOCARBAMOL 1000 MG/10ML IJ SOLN: 500.0000 mg | Freq: Three times a day (TID) | INTRAVENOUS | Status: DC | PRN

## 2023-01-03 MED ORDER — HYDROMORPHONE HCL 1 MG/ML IJ SOLN
1.0000 mg | Freq: Once | INTRAMUSCULAR | Status: AC
  Administered 2023-01-03: 1 mg via INTRAVENOUS
  Filled 2023-01-03: qty 1

## 2023-01-03 MED ORDER — METHOCARBAMOL 500 MG PO TABS
500.0000 mg | ORAL_TABLET | Freq: Three times a day (TID) | ORAL | Status: DC | PRN
  Administered 2023-01-04 – 2023-01-06 (×4): 500 mg via ORAL
  Filled 2023-01-03 (×4): qty 1

## 2023-01-03 MED ORDER — ACETAMINOPHEN 650 MG RE SUPP: 650.0000 mg | Freq: Four times a day (QID) | RECTAL | Status: DC | PRN

## 2023-01-03 MED ORDER — ONDANSETRON HCL 4 MG/2ML IJ SOLN
4.0000 mg | Freq: Once | INTRAMUSCULAR | Status: AC
  Administered 2023-01-03: 4 mg via INTRAVENOUS
  Filled 2023-01-03: qty 2

## 2023-01-03 MED ORDER — LIDOCAINE VISCOUS HCL 2 % MT SOLN: 15.0000 mL | Freq: Once | OROMUCOSAL | Status: DC

## 2023-01-03 MED ORDER — IOHEXOL 350 MG/ML SOLN
60.0000 mL | Freq: Once | INTRAVENOUS | Status: AC | PRN
  Administered 2023-01-03: 60 mL via INTRAVENOUS

## 2023-01-03 MED ORDER — PROCHLORPERAZINE EDISYLATE 10 MG/2ML IJ SOLN
5.0000 mg | Freq: Four times a day (QID) | INTRAMUSCULAR | Status: DC | PRN
  Administered 2023-01-03: 5 mg via INTRAVENOUS
  Administered 2023-01-04: 10 mg via INTRAVENOUS
  Filled 2023-01-03 (×2): qty 2

## 2023-01-03 MED ORDER — FAMOTIDINE IN NACL 20-0.9 MG/50ML-% IV SOLN: 20.0000 mg | Freq: Once | INTRAVENOUS | Status: DC

## 2023-01-03 MED ORDER — HYDROCHLOROTHIAZIDE 12.5 MG PO TABS
12.5000 mg | ORAL_TABLET | Freq: Every day | ORAL | Status: DC
  Administered 2023-01-03 – 2023-01-06 (×3): 12.5 mg via ORAL
  Filled 2023-01-03 (×3): qty 1

## 2023-01-03 MED ORDER — HYDRALAZINE HCL 20 MG/ML IJ SOLN: 10.0000 mg | INTRAMUSCULAR | Status: DC | PRN

## 2023-01-03 MED ORDER — LACTATED RINGERS IV BOLUS
1000.0000 mL | Freq: Once | INTRAVENOUS | Status: AC
  Administered 2023-01-03: 1000 mL via INTRAVENOUS

## 2023-01-03 MED ORDER — HYDROMORPHONE HCL 1 MG/ML IJ SOLN
0.5000 mg | Freq: Once | INTRAMUSCULAR | Status: AC
  Administered 2023-01-03: 0.5 mg via INTRAVENOUS
  Filled 2023-01-03: qty 1

## 2023-01-03 MED ORDER — HYDROMORPHONE HCL 1 MG/ML IJ SOLN
0.5000 mg | INTRAMUSCULAR | Status: DC | PRN
  Administered 2023-01-03 – 2023-01-04 (×4): 1 mg via INTRAVENOUS
  Filled 2023-01-03 (×4): qty 1

## 2023-01-03 MED ORDER — ONDANSETRON HCL 4 MG/2ML IJ SOLN
4.0000 mg | Freq: Four times a day (QID) | INTRAMUSCULAR | Status: DC | PRN
  Administered 2023-01-03 – 2023-01-06 (×5): 4 mg via INTRAVENOUS
  Filled 2023-01-03 (×5): qty 2

## 2023-01-03 MED ORDER — PROCHLORPERAZINE MALEATE 10 MG PO TABS: 10.0000 mg | ORAL_TABLET | Freq: Four times a day (QID) | ORAL | Status: DC | PRN

## 2023-01-03 MED ORDER — ALUM & MAG HYDROXIDE-SIMETH 200-200-20 MG/5ML PO SUSP: 30.0000 mL | Freq: Once | ORAL | Status: DC

## 2023-01-03 MED ORDER — DIPHENHYDRAMINE HCL 12.5 MG/5ML PO ELIX
12.5000 mg | ORAL_SOLUTION | Freq: Four times a day (QID) | ORAL | Status: DC | PRN
  Administered 2023-01-05: 12.5 mg via ORAL
  Filled 2023-01-03: qty 10

## 2023-01-03 MED ORDER — SODIUM CHLORIDE 0.9 % IV SOLN: INTRAVENOUS | Status: DC

## 2023-01-03 MED ORDER — ASPIRIN 81 MG PO CHEW
324.0000 mg | CHEWABLE_TABLET | Freq: Once | ORAL | Status: AC
  Administered 2023-01-03: 324 mg via ORAL
  Filled 2023-01-03: qty 4

## 2023-01-03 MED ORDER — DIPHENHYDRAMINE HCL 50 MG/ML IJ SOLN: 12.5000 mg | Freq: Four times a day (QID) | INTRAMUSCULAR | Status: DC | PRN

## 2023-01-03 MED ORDER — ENOXAPARIN SODIUM 40 MG/0.4ML IJ SOSY
40.0000 mg | PREFILLED_SYRINGE | INTRAMUSCULAR | Status: DC
  Administered 2023-01-03 – 2023-01-05 (×3): 40 mg via SUBCUTANEOUS
  Filled 2023-01-03 (×3): qty 0.4

## 2023-01-03 MED ORDER — PANTOPRAZOLE SODIUM 40 MG IV SOLR
40.0000 mg | Freq: Every day | INTRAVENOUS | Status: DC
  Administered 2023-01-03 – 2023-01-05 (×3): 40 mg via INTRAVENOUS
  Filled 2023-01-03 (×3): qty 10

## 2023-01-03 MED ORDER — LISINOPRIL 10 MG PO TABS
10.0000 mg | ORAL_TABLET | Freq: Every day | ORAL | Status: DC
  Administered 2023-01-03 – 2023-01-06 (×3): 10 mg via ORAL
  Filled 2023-01-03 (×3): qty 1

## 2023-01-03 MED ORDER — ONDANSETRON 4 MG PO TBDP: 4.0000 mg | ORAL_TABLET | Freq: Four times a day (QID) | ORAL | Status: DC | PRN

## 2023-01-03 MED ORDER — OXYCODONE HCL 5 MG PO TABS
5.0000 mg | ORAL_TABLET | ORAL | Status: DC | PRN
  Filled 2023-01-03: qty 1

## 2023-01-03 MED ORDER — METOCLOPRAMIDE HCL 5 MG/ML IJ SOLN
10.0000 mg | Freq: Once | INTRAMUSCULAR | Status: AC
  Administered 2023-01-03: 10 mg via INTRAVENOUS
  Filled 2023-01-03: qty 2

## 2023-01-03 MED ORDER — NITROGLYCERIN 2 % TD OINT
1.0000 [in_us] | TOPICAL_OINTMENT | Freq: Once | TRANSDERMAL | Status: AC
  Administered 2023-01-03: 1 [in_us] via TOPICAL
  Filled 2023-01-03: qty 1

## 2023-01-03 MED ORDER — SIMETHICONE 80 MG PO CHEW: 40.0000 mg | CHEWABLE_TABLET | Freq: Four times a day (QID) | ORAL | Status: DC | PRN

## 2023-01-03 MED ORDER — POLYETHYLENE GLYCOL 3350 17 G PO PACK: 17.0000 g | PACK | Freq: Every day | ORAL | Status: DC | PRN

## 2023-01-03 MED ORDER — LISINOPRIL-HYDROCHLOROTHIAZIDE 10-12.5 MG PO TABS: 1.0000 | ORAL_TABLET | Freq: Every day | ORAL | Status: DC

## 2023-01-03 MED ORDER — SODIUM CHLORIDE 0.9 % IV SOLN
2.0000 g | INTRAVENOUS | Status: DC
  Administered 2023-01-03 – 2023-01-05 (×3): 2 g via INTRAVENOUS
  Filled 2023-01-03 (×3): qty 20

## 2023-01-03 MED ORDER — FAMOTIDINE IN NACL 20-0.9 MG/50ML-% IV SOLN
20.0000 mg | Freq: Once | INTRAVENOUS | Status: AC
  Administered 2023-01-03: 20 mg via INTRAVENOUS
  Filled 2023-01-03: qty 50

## 2023-01-03 MED ORDER — ACETAMINOPHEN 325 MG PO TABS: 650.0000 mg | ORAL_TABLET | Freq: Four times a day (QID) | ORAL | Status: DC | PRN

## 2023-01-03 NOTE — ED Provider Notes (Signed)
[facility] Provider Note   CSN: [id number] Arrival date & time: 01/03/23  0940     History  Chief Complaint  Patient presents with   Chest Pain    Ernest Day is a 72 y.o. male.   Chest Pain Associated symptoms: abdominal pain, diaphoresis, nausea and vomiting   Patient presents for pain.  Medical history includes HTN, HLD, chronic back pain, onset was late last night.  He takes medicines for blood pressure but has not taken any today.  Last night, he was staying in a hotel room.  He ate some garlic toast, eggs, and drink some beer.  Show thereafter, he had epigastric pain, nausea, and vomiting.  He has been having pain and vomiting throughout the night.  He endorses associated diaphoresis.  Current pain is located in epigastrium and left side of chest.  It is 10/10 in severity.  He continues to have nausea.  Patient denies any history of similar symptoms.     Home Medications Prior to Admission medications   Medication Sig Start Date End Date Taking? Authorizing Provider  HYDROcodone-acetaminophen (NORCO) 10-325 MG per tablet Take 1 tablet by mouth every 6 (six) hours as needed for moderate pain.    [provider]  HYDROcodone-acetaminophen (NORCO) 10-325 MG tablet Take 1 tablet by mouth 2 (two) times daily as needed. 07/25/22     HYDROcodone-acetaminophen (NORCO) 10-325 MG tablet Take 1 tablet by mouth 2 (two) times daily as needed. 07/25/22     HYDROcodone-acetaminophen (NORCO) 10-325 MG tablet Take 1 tablet by mouth 2 (two) times daily as needed. 07/25/22     HYDROcodone-acetaminophen (NORCO) 10-325 MG tablet Take 1 tablet by mouth 2 (two) times daily as needed. 08/23/22     HYDROcodone-acetaminophen (NORCO) 10-325 MG tablet Take 1 tablet by mouth 2 (two) times daily as needed. 12/22/22     lisinopril-hydrochlorothiazide (ZESTORETIC) 10-12.5 MG tablet Take 1 tablet by mouth daily. 08/23/22      lisinopril-hydrochlorothiazide (ZESTORETIC) 10-12.5 MG tablet Take 1 tablet by mouth daily. 12/22/22     oxyCODONE-acetaminophen (PERCOCET) 10-325 MG tablet Take 1 tablet by mouth 2 (two) times daily as needed. 08/23/22     oxyCODONE-acetaminophen (PERCOCET) 10-325 MG tablet Take 1 tablet by mouth 2 (two) times daily as needed. 09/20/22     oxyCODONE-acetaminophen (PERCOCET) 10-325 MG tablet Take 1 tablet by mouth 2 (two) times daily as needed 10/24/22     rosuvastatin (CRESTOR) 20 MG tablet Take 1 tablet (20 mg total) by mouth at bedtime. 08/23/22         Allergies    Patient has no known allergies.    Review of Systems   Review of Systems  Constitutional:  Positive for diaphoresis.  Cardiovascular:  Positive for chest pain.  Gastrointestinal:  Positive for abdominal pain, nausea and vomiting.  All other systems reviewed and are negative.   Physical Exam Updated Vital Signs BP (!) 159/73   Pulse 65   Temp 98.2 F (36.8 C) (Oral)   Resp 18   Ht 5\' 10"  (1.778 m)   Wt 95.3 kg   SpO2 92%   BMI 30.13 kg/m  Physical Exam Vitals and nursing note reviewed.  Constitutional:      General: He is not in acute distress.    Appearance: He is well-developed. He is not ill-appearing, toxic-appearing or diaphoretic.  HENT:     Head: Normocephalic and atraumatic.  Eyes:     Conjunctiva/sclera: Conjunctivae normal.  Cardiovascular:  Rate and Rhythm: Normal rate and regular rhythm.     Heart sounds: No murmur heard. Pulmonary:     Effort: Pulmonary effort is normal. No respiratory distress.     Breath sounds: No decreased breath sounds, wheezing, rhonchi or rales.  Chest:     Chest wall: No tenderness.  Abdominal:     Palpations: Abdomen is soft.     Tenderness: There is abdominal tenderness.  Musculoskeletal:        General: No swelling.     Cervical back: Normal range of motion and neck supple.     Right lower leg: No edema.     Left lower leg: No edema.  Skin:    General:  Skin is warm.     Coloration: Skin is pale. Skin is not cyanotic.  Neurological:     General: No focal deficit present.     Mental Status: He is alert and oriented to person, place, and time.  Psychiatric:        Mood and Affect: Mood normal.        Behavior: Behavior normal.     ED Results / Procedures / Treatments   Labs (all labs ordered are listed, but only abnormal results are displayed) Labs Reviewed  BASIC METABOLIC PANEL - Abnormal; Notable for the following components:      Result Value   CO2 20 (*)    Glucose, Bld 183 (*)    All other components within normal limits  CBC - Abnormal; Notable for the following components:   WBC 13.1 (*)    MCHC 36.1 (*)    All other components within normal limits  I-STAT CHEM 8, ED - Abnormal; Notable for the following components:   Glucose, Bld 184 (*)    All other components within normal limits  MAGNESIUM  LIPASE, BLOOD  HEPATIC FUNCTION PANEL  CBG MONITORING, ED  TROPONIN I (HIGH SENSITIVITY)  TROPONIN I (HIGH SENSITIVITY)    EKG EKG Interpretation  Date/Time:  Wednesday January 03 2023 09:48:18 EDT Ventricular Rate:  60 PR Interval:  134 QRS Duration: 94 QT Interval:  442 QTC Calculation: 442 R Axis:   53 Text Interpretation: Sinus rhythm Minimal ST depression, inferior leads Confirmed by Godfrey Pick ([id number]) on 01/03/2023 11:37:30 AM  Radiology US Abdomen Limited  Result Date: 01/03/2023 CLINICAL DATA:  Right upper quadrant pain EXAM: ULTRASOUND ABDOMEN LIMITED RIGHT UPPER QUADRANT COMPARISON:  Same day CT FINDINGS: Gallbladder: Multiple echogenic, shadowing stones, largest measuring 2.4 cm in diameter. No gallstones or pericholecystic fluid visualized. Sonographer reported significant transducer pressure tenderness on exam. Common bile duct: Diameter: 12 mm. Liver: Left hepatic lobe largely obscured by shadowing bowel gas. No focal lesion identified. Increased hepatic parenchymal echogenicity. Portal vein is patent on color  Doppler imaging with normal direction of blood flow towards the liver. Other: None. IMPRESSION: 1. Cholelithiasis with transducer pressure tenderness on exam, however no gallbladder wall thickening was visualized. Findings are equivocal for acute cholecystitis. 2. Dilated common bile duct measuring up to 12 mm. Correlate with liver function tests. 3. The echogenicity of the liver is increased. This is a nonspecific finding but is most commonly seen with fatty infiltration of the liver. There are no obvious focal liver lesions. 4. Left hepatic lobe largely obscured by shadowing bowel gas. Electronically Signed   By: Davina Poke D.O.   On: 01/03/2023 12:07   CT Angio Chest/Abd/Pel for Dissection W and/or Wo Contrast  Result Date: 01/03/2023 CLINICAL DATA:  Chest pain.  Concern for aortic dissection. EXAM: CT ANGIOGRAPHY CHEST, ABDOMEN AND PELVIS TECHNIQUE: Non-contrast CT of the chest was initially obtained. Multidetector CT imaging through the chest, abdomen and pelvis was performed using the standard protocol during bolus administration of intravenous contrast. Multiplanar reconstructed images and MIPs were obtained and reviewed to evaluate the vascular anatomy. RADIATION DOSE REDUCTION: This exam was performed according to the departmental dose-optimization program which includes automated exposure control, adjustment of the mA and/or kV according to patient size and/or use of iterative reconstruction technique. CONTRAST:  73mL OMNIPAQUE IOHEXOL 350 MG/ML SOLN COMPARISON:  Chest x-ray from same day. FINDINGS: CTA CHEST FINDINGS Cardiovascular: Preferential opacification of the thoracic aorta. No evidence of thoracic aortic aneurysm or dissection. Coronary, aortic arch, and branch vessel atherosclerotic vascular disease. Normal heart size. No pericardial effusion. No central pulmonary embolism. Mediastinum/Nodes: No enlarged mediastinal, hilar, or axillary lymph nodes. Thyroid gland, trachea, and esophagus  demonstrate no significant findings. Small tracheal diverticulum. Lungs/Pleura: Scattered mild bibasilar subsegmental atelectasis and linear scarring. No focal consolidation, pleural effusion, or pneumothorax. Musculoskeletal: No chest wall abnormality. No acute or significant osseous findings. Old healed right clavicle and anterior third through fifth rib fractures. Review of the MIP images confirms the above findings. CTA ABDOMEN AND PELVIS FINDINGS VASCULAR Aorta: Normal caliber aorta without aneurysm, dissection, vasculitis or significant stenosis. Extensive calcified and noncalcified atherosclerotic plaque. 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 SMA: Patent without evidence of aneurysm, dissection, vasculitis or significant stenosis. 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 Renals: Two left and single right renal arteries are patent without evidence of aneurysm, dissection, vasculitis, fibromuscular dysplasia or significant stenosis. IMA: Patent without evidence of aneurysm, dissection, vasculitis or significant stenosis. Inflow: Patent without evidence of aneurysm, dissection, vasculitis or significant stenosis. Veins: No obvious venous abnormality within the limitations of this arterial phase study. Review of the MIP images confirms the above findings. NON-VASCULAR Hepatobiliary: No focal liver abnormality. Multiple large gallstones. No gallbladder wall thickening. Moderate common bile duct dilatation up to 1.2 cm, without clear obstructing gallstone or mass. Pancreas: Unremarkable. No pancreatic ductal dilatation or surrounding inflammatory changes. Spleen: Normal in size without focal abnormality. Adrenals/Urinary Tract: Adrenal glands are unremarkable. Kidneys are normal, without renal calculi, focal lesion, or hydronephrosis. Bladder is unremarkable. Stomach/Bowel:  Stomach is within normal limits. Appendix appears normal. No evidence of bowel wall thickening, distention, or inflammatory changes. Mild left-sided colonic diverticulosis. Lymphatic: No enlarged abdominal or pelvic lymph nodes. Reproductive: Prostate is unremarkable. Other: No free fluid or pneumoperitoneum. Musculoskeletal: No acute or significant osseous findings. Review of the MIP images confirms the above findings. IMPRESSION: 1. No evidence of aortic aneurysm or dissection. 2. 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 4. Cholelithiasis. Moderate common bile duct dilatation up to 1.2 cm, without clear obstructing gallstone or mass. Correlate with LFTs. 5.  Aortic atherosclerosis (ICD10-I70.0). Electronically Signed   By: Titus Dubin M.D.   On: 01/03/2023 11:29   DG Chest 2 View  Result Date: 01/03/2023 CLINICAL DATA:  Hervey Ard left-sided chest pain EXAM: CHEST - 2 VIEW COMPARISON:  12/31/2013 FINDINGS: Artifact overlies the chest. Chronic relative elevation of the right hemidiaphragm. Heart size is normal. Mediastinal shadows are normal. The lungs are clear. Pulmonary vascularity is normal. No effusions. No abnormal bone finding. IMPRESSION: No active cardiopulmonary disease. Chronic  relative elevation of the right hemidiaphragm. Electronically Signed   By: Nelson Chimes M.D.   On: 01/03/2023 10:56    Procedures Procedures    Medications Ordered in ED Medications  enoxaparin (LOVENOX) injection 40 mg (has no administration in time range)  0.9 %  sodium chloride infusion (has no administration in time range)  hydrALAZINE (APRESOLINE) injection 10 mg (has no administration in time range)  pantoprazole (PROTONIX) injection 40 mg (has no administration in time range)  simethicone (MYLICON) chewable tablet 40 mg (has no administration in time range)   ondansetron (ZOFRAN-ODT) disintegrating tablet 4 mg (has no administration in time range)    Or  ondansetron (ZOFRAN) injection 4 mg (has no administration in time range)  prochlorperazine (COMPAZINE) tablet 10 mg (has no administration in time range)    Or  prochlorperazine (COMPAZINE) injection 5-10 mg (has no administration in time range)  polyethylene glycol (MIRALAX / GLYCOLAX) packet 17 g (has no administration in time range)  diphenhydrAMINE (BENADRYL) 12.5 MG/5ML elixir 12.5 mg (has no administration in time range)    Or  diphenhydrAMINE (BENADRYL) injection 12.5 mg (has no administration in time range)  methocarbamol (ROBAXIN) tablet 500 mg (has no administration in time range)    Or  methocarbamol (ROBAXIN) 500 mg in dextrose 5 % 50 mL IVPB (has no administration in time range)  HYDROmorphone (DILAUDID) injection 0.5-1 mg (has no administration in time range)  oxyCODONE (Oxy IR/ROXICODONE) immediate release tablet 5-10 mg (has no administration in time range)  acetaminophen (TYLENOL) tablet 650 mg (has no administration in time range)    Or  acetaminophen (TYLENOL) suppository 650 mg (has no administration in time range)  cefTRIAXone (ROCEPHIN) 2 g in sodium chloride 0.9 % 100 mL IVPB (has no administration in time range)  lisinopril (ZESTRIL) tablet 10 mg (has no administration in time range)  hydrochlorothiazide (HYDRODIURIL) tablet 12.5 mg (has no administration in time range)  aspirin chewable tablet 324 mg (324 mg Oral Given 01/03/23 1008)  nitroGLYCERIN (NITROGLYN) 2 % ointment 1 inch (1 inch Topical Given 01/03/23 1009)  HYDROmorphone (DILAUDID) injection 1 mg (1 mg Intravenous Given 01/03/23 1009)  ondansetron (ZOFRAN) injection 4 mg (4 mg Intravenous Given 01/03/23 1009)  lactated ringers bolus 1,000 mL (0 mLs Intravenous Stopped 01/03/23 1128)  iohexol (OMNIPAQUE) 350 MG/ML injection 60 mL (60 mLs Intravenous Contrast Given 01/03/23 1058)  HYDROmorphone (DILAUDID) injection 0.5  mg (0.5 mg Intravenous Given 01/03/23 1127)  HYDROmorphone (DILAUDID) injection 0.5 mg (0.5 mg Intravenous Given 01/03/23 1317)  metoCLOPramide (REGLAN) injection 10 mg (10 mg Intravenous Given 01/03/23 1317)  famotidine (PEPCID) IVPB 20 mg premix (20 mg Intravenous New Bag/Given 01/03/23 1324)    ED Course/ Medical Decision Making/ A&P                             Medical Decision Making Amount and/or Complexity of Data Reviewed Labs: ordered. Radiology: ordered.  Risk OTC drugs. Prescription drug management. Decision regarding hospitalization.   This patient presents to the ED for concern of epigastric and chest pain, this involves an extensive number of treatment options, and is a complaint that carries with it a high risk of complications and morbidity.  The differential diagnosis includes cholecystitis, PUD, ACS, aortic dissection, pericarditis, pneumonia   Co morbidities that complicate the patient evaluation  HTN, HLD, nephrolithiasis   Additional history obtained:  Additional history obtained from patient's brother External records from outside source obtained and  reviewed including EMR   Lab Tests:  I Ordered, and personally interpreted labs.  The pertinent results include: A leukocytosis is present.  Lab work is otherwise unremarkable.  Lipase and troponin are normal.   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  I personally viewed and interpreted the cardiac monitored which showed an underlying rhythm of: Sinus rhythm   Consultations Obtained:  I requested consultation with the general surgery,  and discussed lab and  imaging findings as well as pertinent plan - they recommend: Admission to their service for planned operative management   Problem List / ED Course / Critical interventions / Medication management  Patient presents for epigastric pain, chest pain, nausea, and vomiting.  Onset was late last night, shortly after he ate.  He has been having symptoms since that time.  Vital signs on arrival are notable for hypertension.  He does take lisinopril and HCTZ.  He has not taken any today.  Patient is alert and oriented.  He is mildly tachypneic.  Lungs are clear to auscultation.  Epigastric and right upper quadrant tenderness are present.  EKG shows diffuse minimal ST segment depression.  Dilaudid and Zofran ordered for symptomatic relief.  Diagnostic workup was initiated.  Given his persistent vomiting throughout the night, IV fluids were ordered for hydration.  Patient's initial lab work is notable for leukocytosis.  Lipase and initial troponin are normal.  Patient with persistent pain and additional Dilaudid was ordered.  On CT dissection study, lungs and aorta are normal in appearance.  He does have gallstones present, which is likely the source of his symptoms.  Ultrasound was ordered to further characterize.  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  Given the persistence of his symptoms, I do not suspect this is the cause of his presentation to the ED today.  Patient had positive sonographic Murphy sign on ultrasound.  General surgery was consulted. They evaluated the patient in the ED and will admit. I ordered medication including ASA and NTG for chest pain; Zofran and Reglan for nausea; Dilaudid for analgesia Reevaluation of the patient after these medicines showed that the patient improved I have reviewed the patients home medicines and have made adjustments as needed   Social Determinants of Health:  Has access to outpatient  care        Final Clinical Impression(s) / ED Diagnoses Final diagnoses:  Cholecystitis    Rx / DC Orders ED Discharge Orders     None         Godfrey Pick, MD 01/03/23 [phone]

## 2023-01-03 NOTE — ED Triage Notes (Signed)
Pt came in via POV d/t Lt sided CP that started 10 hrs ago, denies radiation but states he felt a pop in his upper Abd earlier. Rates his pain 10/10. No cardiac Hx other than HTN (per pt). Feels anxious, nauseated & diaphoretic.

## 2023-01-03 NOTE — ED Notes (Signed)
ED TO INPATIENT HANDOFF REPORT  ED Nurse Name and Phone #: Josh  S Name/Age/Gender Ernest Day 72 y.o. male Room/Bed: 015C/015C  Code Status   Code Status: Full Code  Home/SNF/Other Home Patient oriented to:x4 Is this baseline? Yes   Triage Complete: Triage complete  Chief Complaint Acute cholecystitis [K81.0]  Triage Note Pt came in via POV d/t Lt sided CP that started 10 hrs ago, denies radiation but states he felt a pop in his upper Abd earlier. Rates his pain 10/10. No cardiac Hx other than HTN (per pt). Feels anxious, nauseated & diaphoretic.   Allergies No Known Allergies  Level of Care/Admitting Diagnosis ED Disposition     ED Disposition  Admit   Condition  --   Comment  Hospital Area: [facility] [[id number]]  Level of Care: Med-Surg [16]  May place patient in observation at [facility] or [facility] if equivalent level of care is available:: No  Covid Evaluation: Asymptomatic - no recent exposure (last 10 days) testing not required  Diagnosis: Acute cholecystitis [575.0.ICD-9-CM]  Admitting Physician: [place], [place]  Attending Physician: [facility], [place]          B Medical/Surgery History History reviewed. No pertinent past medical history. History reviewed. No pertinent surgical history.   A IV Location/Drains/Wounds Patient Lines/Drains/Airways Status     Active Line/Drains/Airways     Name Placement date Placement time Site Days   Peripheral IV 01/03/23 20 G Anterior;Distal;Left;Upper Arm 01/03/23  1002  Arm  less than 1            Intake/Output Last 24 hours No intake or output data in the 24 hours ending 01/03/23 1446  Labs/Imaging Results for orders placed or performed during the hospital encounter of 01/03/23 (from the past 48 hour(s))  Basic metabolic panel     Status: Abnormal   Collection Time: 01/03/23 10:00 AM  Result Value Ref Range   Sodium 136 135 - 145 mmol/L   Potassium 4.2 3.5 - 5.1 mmol/L    Chloride 102 98 - 111 mmol/L   CO2 20 (L) 22 - 32 mmol/L   Glucose, Bld 183 (H) 70 - 99 mg/dL    Comment: Glucose reference range applies only to samples taken after fasting for at least 8 hours.   BUN 17 8 - 23 mg/dL   Creatinine, Ser 1.15 0.61 - 1.24 mg/dL   Calcium 10.0 8.9 - 10.3 mg/dL   GFR, Estimated >60 >60 mL/min    Comment: (NOTE) Calculated using the CKD-EPI Creatinine Equation (2021)    Anion gap 14 5 - 15    Comment: Performed at [facility] [address], [place], [place] [address]  CBC     Status: Abnormal   Collection Time: 01/03/23 10:00 AM  Result Value Ref Range   WBC 13.1 (H) 4.0 - 10.5 K/uL   RBC 4.82 4.22 - 5.81 MIL/uL   Hemoglobin 16.0 13.0 - 17.0 g/dL   HCT 44.3 39.0 - 52.0 %   MCV 91.9 80.0 - 100.0 fL   MCH 33.2 26.0 - 34.0 pg   MCHC 36.1 (H) 30.0 - 36.0 g/dL   RDW 11.9 11.5 - 15.5 %   Platelets 345 150 - 400 K/uL   nRBC 0.0 0.0 - 0.2 %    Comment: Performed at [facility], [place] [address] [place], [place] [address]  Troponin I (High Sensitivity)     Status: None   Collection Time: 01/03/23 10:00  AM  Result Value Ref Range   Troponin I (High Sensitivity) 11 <18 ng/L    Comment: (NOTE) Elevated high sensitivity troponin I (hsTnI) values and significant  changes across serial measurements may suggest ACS but many other  chronic and acute conditions are known to elevate hsTnI results.  Refer to the "Links" section for chest pain algorithms and additional  guidance. Performed at [facility], [place] [address] [place], [place] [address]   Magnesium     Status: None   Collection Time: 01/03/23 10:00 AM  Result Value Ref Range   Magnesium 1.9 1.7 - 2.4 mg/dL    Comment: Performed at [facility], [place] [address] [place], [place] [address]  Lipase, blood     Status: None   Collection Time: 01/03/23 10:00 AM  Result Value Ref Range   Lipase 31 11 - 51 U/L    Comment: Performed at [facility] [address],  [place], [place] [address]  Hepatic function panel     Status: None   Collection Time: 01/03/23 10:00 AM  Result Value Ref Range   Total Protein 7.9 6.5 - 8.1 g/dL   Albumin 4.1 3.5 - 5.0 g/dL   AST 22 15 - 41 U/L   ALT 20 0 - 44 U/L   Alkaline Phosphatase 58 38 - 126 U/L   Total Bilirubin 1.0 0.3 - 1.2 mg/dL   Bilirubin, Direct 0.1 0.0 - 0.2 mg/dL   Indirect Bilirubin 0.9 0.3 - 0.9 mg/dL    Comment: Performed at [facility] [address] [place], [place] [address]  I-stat chem 8, ED     Status: Abnormal   Collection Time: 01/03/23 10:07 AM  Result Value Ref Range   Sodium 138 135 - 145 mmol/L   Potassium 4.3 3.5 - 5.1 mmol/L   Chloride 104 98 - 111 mmol/L   BUN 19 8 - 23 mg/dL   Creatinine, Ser 0.90 0.61 - 1.24 mg/dL   Glucose, Bld 184 (H) 70 - 99 mg/dL    Comment: Glucose reference range applies only to samples taken after fasting for at least 8 hours.   Calcium, Ion 1.18 1.15 - 1.40 mmol/L   TCO2 22 22 - 32 mmol/L   Hemoglobin 15.3 13.0 - 17.0 g/dL   HCT 45.0 39.0 - 52.0 %  Troponin I (High Sensitivity)     Status: None   Collection Time: 01/03/23  1:25 PM  Result Value Ref Range   Troponin I (High Sensitivity) 17 <18 ng/L    Comment: (NOTE) Elevated high sensitivity troponin I (hsTnI) values and significant  changes across serial measurements may suggest ACS but many other  chronic and acute conditions are known to elevate hsTnI results.  Refer to the "Links" section for chest pain algorithms and additional  guidance. Performed at [facility], [place] [address], [place], [place] [address]    US Abdomen Limited  Result Date: 01/03/2023 CLINICAL DATA:  Right upper quadrant pain EXAM: ULTRASOUND ABDOMEN LIMITED RIGHT UPPER QUADRANT COMPARISON:  Same day CT FINDINGS: Gallbladder: Multiple echogenic, shadowing stones, largest measuring 2.4 cm in diameter. No gallstones or pericholecystic fluid visualized. Sonographer reported significant transducer pressure tenderness  on exam. Common bile duct: Diameter: 12 mm. Liver: Left hepatic lobe largely obscured by shadowing bowel gas. No focal lesion identified. Increased hepatic parenchymal echogenicity. Portal vein is patent on color Doppler imaging with normal direction of blood flow towards the liver. Other: None.  IMPRESSION: 1. Cholelithiasis with transducer pressure tenderness on exam, however no gallbladder wall thickening was visualized. Findings are equivocal for acute cholecystitis. 2. Dilated common bile duct measuring up to 12 mm. Correlate with liver function tests. 3. The echogenicity of the liver is increased. This is a nonspecific finding but is most commonly seen with fatty infiltration of the liver. There are no obvious focal liver lesions. 4. Left hepatic lobe largely obscured by shadowing bowel gas. Electronically Signed   By: Davina Poke D.O.   On: 01/03/2023 12:07   CT Angio Chest/Abd/Pel for Dissection W and/or Wo Contrast  Result Date: 01/03/2023 CLINICAL DATA:  Chest pain.  Concern for aortic dissection. EXAM: CT ANGIOGRAPHY CHEST, ABDOMEN AND PELVIS TECHNIQUE: Non-contrast CT of the chest was initially obtained. Multidetector CT imaging through the chest, abdomen and pelvis was performed using the standard protocol during bolus administration of intravenous contrast. Multiplanar reconstructed images and MIPs were obtained and reviewed to evaluate the vascular anatomy. RADIATION DOSE REDUCTION: This exam was performed according to the departmental dose-optimization program which includes automated exposure control, adjustment of the mA and/or kV according to patient size and/or use of iterative reconstruction technique. CONTRAST:  80mL OMNIPAQUE IOHEXOL 350 MG/ML SOLN COMPARISON:  Chest x-ray from same day. FINDINGS: CTA CHEST FINDINGS Cardiovascular: Preferential opacification of the thoracic aorta. No evidence of thoracic aortic aneurysm or dissection. Coronary, aortic arch, and branch vessel  atherosclerotic vascular disease. Normal heart size. No pericardial effusion. No central pulmonary embolism. Mediastinum/Nodes: No enlarged mediastinal, hilar, or axillary lymph nodes. Thyroid gland, trachea, and esophagus demonstrate no significant findings. Small tracheal diverticulum. Lungs/Pleura: Scattered mild bibasilar subsegmental atelectasis and linear scarring. No focal consolidation, pleural effusion, or pneumothorax. Musculoskeletal: No chest wall abnormality. No acute or significant osseous findings. Old healed right clavicle and anterior third through fifth rib fractures. Review of the MIP images confirms the above findings. CTA ABDOMEN AND PELVIS FINDINGS VASCULAR Aorta: Normal caliber aorta without aneurysm, dissection, vasculitis or significant stenosis. Extensive calcified and noncalcified atherosclerotic plaque. 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 SMA: Patent without evidence of aneurysm, dissection, vasculitis or significant stenosis. 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 Renals: Two left and single right renal arteries are patent without evidence of aneurysm, dissection, vasculitis, fibromuscular dysplasia or significant stenosis. IMA: Patent without evidence of aneurysm, dissection, vasculitis or significant stenosis. Inflow: Patent without evidence of aneurysm, dissection, vasculitis or significant stenosis. Veins: No obvious venous abnormality within the limitations of this arterial phase study. Review of the MIP images confirms the above findings. NON-VASCULAR Hepatobiliary: No focal liver abnormality. Multiple large gallstones. No gallbladder wall thickening. Moderate common bile duct dilatation up to 1.2 cm, without clear obstructing gallstone or mass. Pancreas: Unremarkable. No pancreatic ductal dilatation or surrounding inflammatory changes.  Spleen: Normal in size without focal abnormality. Adrenals/Urinary Tract: Adrenal glands are unremarkable. Kidneys are normal, without renal calculi, focal lesion, or hydronephrosis. Bladder is unremarkable. Stomach/Bowel: Stomach is within normal limits. Appendix appears normal. No evidence of bowel wall thickening, distention, or inflammatory changes. Mild left-sided colonic diverticulosis. Lymphatic: No enlarged abdominal or pelvic lymph nodes. Reproductive: Prostate is unremarkable. Other: No free fluid or pneumoperitoneum. Musculoskeletal: No acute or significant osseous findings. Review of the MIP images confirms the above findings. IMPRESSION: 1. No evidence of aortic aneurysm or dissection. 2. 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 3.  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 4. Cholelithiasis. Moderate common bile duct dilatation up to 1.2 cm, without clear obstructing gallstone or mass. Correlate with LFTs. 5.  Aortic atherosclerosis (ICD10-I70.0). Electronically Signed   By: Titus Dubin M.D.   On: 01/03/2023 11:29   DG Chest 2 View  Result Date: 01/03/2023 CLINICAL DATA:  Hervey Ard left-sided chest pain EXAM: CHEST - 2 VIEW COMPARISON:  12/31/2013 FINDINGS: Artifact overlies the chest. Chronic relative elevation of the right hemidiaphragm. Heart size is normal. Mediastinal shadows are normal. The lungs are clear. Pulmonary vascularity is normal. No effusions. No abnormal bone finding. IMPRESSION: No active cardiopulmonary disease. Chronic relative elevation of the right hemidiaphragm. Electronically Signed   By: Nelson Chimes M.D.   On: 01/03/2023 10:56    Pending Labs Unresulted Labs (From admission, onward)     Start     Ordered   01/10/23 0500  Creatinine, serum  (enoxaparin (LOVENOX)    CrCl >/= 30 ml/min)  Weekly,   R     Comments: while on enoxaparin therapy    01/03/23 1433    01/04/23 0500  Comprehensive metabolic panel  Tomorrow morning,   R        01/03/23 1433   01/04/23 0500  CBC  Tomorrow morning,   R        01/03/23 1433            Vitals/Pain Today's Vitals   01/03/23 1300 01/03/23 1315 01/03/23 1401 01/03/23 1430  BP: (!) 166/65 (!) 157/68 132/66 (!) 159/73  Pulse: 62 (!) 59 60 65  Resp: 10 16 11 18   Temp:   98.2 F (36.8 C)   TempSrc:   Oral   SpO2: 97% 95% 95% 92%  Weight:      Height:      PainSc:        Isolation Precautions No active isolations  Medications Medications  enoxaparin (LOVENOX) injection 40 mg (has no administration in time range)  0.9 %  sodium chloride infusion (has no administration in time range)  hydrALAZINE (APRESOLINE) injection 10 mg (has no administration in time range)  pantoprazole (PROTONIX) injection 40 mg (has no administration in time range)  simethicone (MYLICON) chewable tablet 40 mg (has no administration in time range)  ondansetron (ZOFRAN-ODT) disintegrating tablet 4 mg (has no administration in time range)    Or  ondansetron (ZOFRAN) injection 4 mg (has no administration in time range)  prochlorperazine (COMPAZINE) tablet 10 mg (has no administration in time range)    Or  prochlorperazine (COMPAZINE) injection 5-10 mg (has no administration in time range)  polyethylene glycol (MIRALAX / GLYCOLAX) packet 17 g (has no administration in time range)  diphenhydrAMINE (BENADRYL) 12.5 MG/5ML elixir 12.5 mg (has no administration in time range)    Or  diphenhydrAMINE (BENADRYL) injection 12.5 mg (has no administration in time range)  methocarbamol (ROBAXIN) tablet 500 mg (has no administration in time range)    Or  methocarbamol (ROBAXIN) 500 mg in dextrose 5 % 50 mL IVPB (has no administration in time range)  HYDROmorphone (DILAUDID) injection 0.5-1 mg (has no administration in time range)  oxyCODONE (Oxy IR/ROXICODONE) immediate release tablet 5-10 mg (has no administration in time range)   acetaminophen (TYLENOL) tablet 650 mg (has no administration in time range)    Or  acetaminophen (TYLENOL) suppository 650 mg (has no administration in time range)  cefTRIAXone (ROCEPHIN) 2 g in sodium chloride 0.9 % 100 mL IVPB (has no administration  in time range)  lisinopril (ZESTRIL) tablet 10 mg (has no administration in time range)  hydrochlorothiazide (HYDRODIURIL) tablet 12.5 mg (has no administration in time range)  aspirin chewable tablet 324 mg (324 mg Oral Given 01/03/23 1008)  nitroGLYCERIN (NITROGLYN) 2 % ointment 1 inch (1 inch Topical Given 01/03/23 1009)  HYDROmorphone (DILAUDID) injection 1 mg (1 mg Intravenous Given 01/03/23 1009)  ondansetron (ZOFRAN) injection 4 mg (4 mg Intravenous Given 01/03/23 1009)  lactated ringers bolus 1,000 mL (0 mLs Intravenous Stopped 01/03/23 1128)  iohexol (OMNIPAQUE) 350 MG/ML injection 60 mL (60 mLs Intravenous Contrast Given 01/03/23 1058)  HYDROmorphone (DILAUDID) injection 0.5 mg (0.5 mg Intravenous Given 01/03/23 1127)  HYDROmorphone (DILAUDID) injection 0.5 mg (0.5 mg Intravenous Given 01/03/23 1317)  metoCLOPramide (REGLAN) injection 10 mg (10 mg Intravenous Given 01/03/23 1317)  famotidine (PEPCID) IVPB 20 mg premix (20 mg Intravenous New Bag/Given 01/03/23 1324)    Mobility walks     Focused Assessments     R Recommendations: See Admitting Provider Note  Report given to:   Additional Notes:

## 2023-01-03 NOTE — H&P (Signed)
Ernest Day 05/08/1951  [id number].    Requesting MD: Dr. Godfrey Pick Chief Complaint/Reason for Consult: Acute Cholecystitis   HPI: Ernest Day is a 72 y.o. male with a history of hypertension, hyperlipidemia and chronic back pain on Norco who presented to the ED for abdominal pain.  Pain began last night after he had eggs, toast and beer.  Pain is located in his epigastrium without radiation and associate with nausea and vomiting.  No history of similar symptoms in the past.  He underwent workup in the ED.  He was found to be afebrile without tachycardia or hypotension.  He was noted to be hypertensive to 207/78.  WBC 13.1. Lipase and LFT's wnl. Tn wnl. 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 Also noted to have cholelithiasis with CBD dilation to 1.2 cm without obvious choledocholithiasis.  RUQ [place] with multiple gallstones measuring up to 2.4 cm without gallbladder wall thickening or pericholecystic fluid.  CBD 12 mm. We were asked to see.   Prior Abdominal Surgeries: None Blood Thinners: None Allergies: NKDA Tobacco Use: None Alcohol Use: Occasional Substance use: None  Employment: insurance  Denies a history of MI, CVA, asthma, or COPD. At baseline states he mobilizes without an assistive device and can climb a flight of stairs without stopping due to DOE or chest pain.  ROS: ROS As above, see hpi  History reviewed. No pertinent family history.  History reviewed. No pertinent past medical history.  History reviewed. No pertinent surgical history.  Social History:  reports that he has never smoked. He has never used smokeless tobacco. He reports that he does not drink alcohol and does not use drugs.  Allergies: No Known Allergies  (Not in a hospital admission)    Physical Exam: Blood pressure (!) 168/78,  pulse (!) 58, temperature 98.2 F (36.8 C), temperature source Oral, resp. rate 11, height 5\' 10"  (1.778 m), weight 95.3 kg, SpO2 98 %. General: WD/WN male who is laying in bed in NAD HEENT: head is normocephalic, atraumatic.  Sclera are noninjected.  Pupils equal and round.  Ears and nose without any masses or lesions.  Mouth is pink and moist. Dentition fair Heart: regular, rate, and rhythm Lungs: CTAB, no wheezes, rhonchi, or rales noted.  Respiratory effort nonlabored on room air Abd: soft, ND, +BS, no masses, hernias, or organomegaly. TTP epigastrium and RUQ MS: no BUE/BLE edema, calves soft and nontender Skin: warm and dry with no masses, lesions, or rashes Psych: A&Ox4 with an appropriate affect Neuro: MAEs, no gross motor or sensory deficits BUE/BLE  Results for orders placed or performed during the hospital encounter of 01/03/23 (from the past 48 hour(s))  Basic metabolic panel     Status: Abnormal   Collection Time: 01/03/23 10:00 AM  Result Value Ref Range   Sodium 136 135 - 145 mmol/L   Potassium 4.2 3.5 - 5.1 mmol/L   Chloride 102 98 - 111 mmol/L   CO2 20 (L) 22 - 32 mmol/L   Glucose, Bld 183 (H) 70 - 99 mg/dL    Comment: Glucose reference range applies only to samples taken after fasting for at least 8 hours.   BUN 17 8 - 23 mg/dL   Creatinine, Ser 1.15 0.61 - 1.24 mg/dL   Calcium 10.0 8.9 - 10.3 mg/dL   GFR, Estimated >60 >60 mL/min    Comment: (NOTE) Calculated using  the CKD-EPI Creatinine Equation (2021)    Anion gap 14 5 - 15    Comment: Performed at [facility], [place] [address], [place], [place] [address]  CBC     Status: Abnormal   Collection Time: 01/03/23 10:00 AM  Result Value Ref Range   WBC 13.1 (H) 4.0 - 10.5 K/uL   RBC 4.82 4.22 - 5.81 MIL/uL   Hemoglobin 16.0 13.0 - 17.0 g/dL   HCT 44.3 39.0 - 52.0 %   MCV 91.9 80.0 - 100.0 fL   MCH 33.2 26.0 - 34.0 pg   MCHC 36.1 (H) 30.0 - 36.0 g/dL   RDW 11.9 11.5 - 15.5 %   Platelets 345 150 - 400 K/uL    nRBC 0.0 0.0 - 0.2 %    Comment: Performed at [facility], [place] [address], [place], [place] [address]  Troponin I (High Sensitivity)     Status: None   Collection Time: 01/03/23 10:00 AM  Result Value Ref Range   Troponin I (High Sensitivity) 11 <18 ng/L    Comment: (NOTE) Elevated high sensitivity troponin I (hsTnI) values and significant  changes across serial measurements may suggest ACS but many other  chronic and acute conditions are known to elevate hsTnI results.  Refer to the "Links" section for chest pain algorithms and additional  guidance. Performed at [facility], [place] [address], [place], [place] [address]   Magnesium     Status: None   Collection Time: 01/03/23 10:00 AM  Result Value Ref Range   Magnesium 1.9 1.7 - 2.4 mg/dL    Comment: Performed at [facility], [place] [address] [place], [address] [address]  Lipase, blood     Status: None   Collection Time: 01/03/23 10:00 AM  Result Value Ref Range   Lipase 31 11 - 51 U/L    Comment: Performed at [place] [address], [place], [place] [address]  Hepatic function panel     Status: None   Collection Time: 01/03/23 10:00 AM  Result Value Ref Range   Total Protein 7.9 6.5 - 8.1 g/dL   Albumin 4.1 3.5 - 5.0 g/dL   AST 22 15 - 41 U/L   ALT 20 0 - 44 U/L   Alkaline Phosphatase 58 38 - 126 U/L   Total Bilirubin 1.0 0.3 - 1.2 mg/dL   Bilirubin, Direct 0.1 0.0 - 0.2 mg/dL   Indirect Bilirubin 0.9 0.3 - 0.9 mg/dL    Comment: Performed at [facility] [address], [place], [place] [address]  I-stat chem 8, ED     Status: Abnormal   Collection Time: 01/03/23 10:07 AM  Result Value Ref Range   Sodium 138 135 - 145 mmol/L   Potassium 4.3 3.5 - 5.1 mmol/L   Chloride 104 98 - 111 mmol/L   BUN 19 8 - 23 mg/dL   Creatinine, Ser 0.90 0.61 - 1.24 mg/dL   Glucose, Bld 184 (H) 70 - 99 mg/dL    Comment: Glucose reference range applies only to samples taken after fasting for at least 8  hours.   Calcium, Ion 1.18 1.15 - 1.40 mmol/L   TCO2 22 22 - 32 mmol/L   Hemoglobin 15.3 13.0 - 17.0 g/dL   HCT 45.0 39.0 - 52.0 %   US Abdomen Limited  Result Date: 01/03/2023 CLINICAL DATA:  Right upper quadrant pain EXAM: ULTRASOUND ABDOMEN LIMITED RIGHT UPPER QUADRANT COMPARISON:  Same day  CT FINDINGS: Gallbladder: Multiple echogenic, shadowing stones, largest measuring 2.4 cm in diameter. No gallstones or pericholecystic fluid visualized. Sonographer reported significant transducer pressure tenderness on exam. Common bile duct: Diameter: 12 mm. Liver: Left hepatic lobe largely obscured by shadowing bowel gas. No focal lesion identified. Increased hepatic parenchymal echogenicity. Portal vein is patent on color Doppler imaging with normal direction of blood flow towards the liver. Other: None. IMPRESSION: 1. Cholelithiasis with transducer pressure tenderness on exam, however no gallbladder wall thickening was visualized. Findings are equivocal for acute cholecystitis. 2. Dilated common bile duct measuring up to 12 mm. Correlate with liver function tests. 3. The echogenicity of the liver is increased. This is a nonspecific finding but is most commonly seen with fatty infiltration of the liver. There are no obvious focal liver lesions. 4. Left hepatic lobe largely obscured by shadowing bowel gas. Electronically Signed   By: Davina Poke D.O.   On: 01/03/2023 12:07   CT Angio Chest/Abd/Pel for Dissection W and/or Wo Contrast  Result Date: 01/03/2023 CLINICAL DATA:  Chest pain.  Concern for aortic dissection. EXAM: CT ANGIOGRAPHY CHEST, ABDOMEN AND PELVIS TECHNIQUE: Non-contrast CT of the chest was initially obtained. Multidetector CT imaging through the chest, abdomen and pelvis was performed using the standard protocol during bolus administration of intravenous contrast. Multiplanar reconstructed images and MIPs were obtained and reviewed to evaluate the vascular anatomy. RADIATION DOSE REDUCTION:  This exam was performed according to the departmental dose-optimization program which includes automated exposure control, adjustment of the mA and/or kV according to patient size and/or use of iterative reconstruction technique. CONTRAST:  9mL OMNIPAQUE IOHEXOL 350 MG/ML SOLN COMPARISON:  Chest x-ray from same day. FINDINGS: CTA CHEST FINDINGS Cardiovascular: Preferential opacification of the thoracic aorta. No evidence of thoracic aortic aneurysm or dissection. Coronary, aortic arch, and branch vessel atherosclerotic vascular disease. Normal heart size. No pericardial effusion. No central pulmonary embolism. Mediastinum/Nodes: No enlarged mediastinal, hilar, or axillary lymph nodes. Thyroid gland, trachea, and esophagus demonstrate no significant findings. Small tracheal diverticulum. Lungs/Pleura: Scattered mild bibasilar subsegmental atelectasis and linear scarring. No focal consolidation, pleural effusion, or pneumothorax. Musculoskeletal: No chest wall abnormality. No acute or significant osseous findings. Old healed right clavicle and anterior third through fifth rib fractures. Review of the MIP images confirms the above findings. CTA ABDOMEN AND PELVIS FINDINGS VASCULAR Aorta: Normal caliber aorta without aneurysm, dissection, vasculitis or significant stenosis. Extensive calcified and noncalcified atherosclerotic plaque. 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 SMA: Patent without evidence of aneurysm, dissection, vasculitis or significant stenosis. 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 Renals: Two left and single right renal arteries are patent without evidence of aneurysm, dissection, vasculitis, fibromuscular dysplasia or significant stenosis. IMA: Patent without evidence of aneurysm, dissection, vasculitis or significant stenosis. Inflow: Patent without evidence of  aneurysm, dissection, vasculitis or significant stenosis. Veins: No obvious venous abnormality within the limitations of this arterial phase study. Review of the MIP images confirms the above findings. NON-VASCULAR Hepatobiliary: No focal liver abnormality. Multiple large gallstones. No gallbladder wall thickening. Moderate common bile duct dilatation up to 1.2 cm, without clear obstructing gallstone or mass. Pancreas: Unremarkable. No pancreatic ductal dilatation or surrounding inflammatory changes. Spleen: Normal in size without focal abnormality. Adrenals/Urinary Tract: Adrenal glands are unremarkable. Kidneys are normal, without renal calculi, focal lesion, or hydronephrosis. Bladder is unremarkable. Stomach/Bowel: Stomach is within normal limits. Appendix appears normal. No evidence of bowel  wall thickening, distention, or inflammatory changes. Mild left-sided colonic diverticulosis. Lymphatic: No enlarged abdominal or pelvic lymph nodes. Reproductive: Prostate is unremarkable. Other: No free fluid or pneumoperitoneum. Musculoskeletal: No acute or significant osseous findings. Review of the MIP images confirms the above findings. IMPRESSION: 1. No evidence of aortic aneurysm or dissection. 2. 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 4. Cholelithiasis. Moderate common bile duct dilatation up to 1.2 cm, without clear obstructing gallstone or mass. Correlate with LFTs. 5.  Aortic atherosclerosis (ICD10-I70.0). Electronically Signed   By: Titus Dubin M.D.   On: 01/03/2023 11:29   DG Chest 2 View  Result Date: 01/03/2023 CLINICAL DATA:  Hervey Ard left-sided chest pain EXAM: CHEST - 2 VIEW COMPARISON:  12/31/2013 FINDINGS: Artifact overlies the chest. Chronic relative elevation of the right hemidiaphragm. Heart size is normal. Mediastinal shadows are normal. The  lungs are clear. Pulmonary vascularity is normal. No effusions. No abnormal bone finding. IMPRESSION: No active cardiopulmonary disease. Chronic relative elevation of the right hemidiaphragm. Electronically Signed   By: Nelson Chimes M.D.   On: 01/03/2023 10:56    Anti-infectives (From admission, onward)    None       Assessment/Plan Acute Cholecystitis  Patient is a 72 y.o. male who presented with acute onset epigastric/RUQ abdominal pain. Workup is concerning for Acute Cholecystitis. Recommend abx and Laparoscopic Cholecystectomy. I have explained the procedure, risks, and aftercare of Laparoscopic cholecystectomy with IOC.  Risks include but are not limited to anesthesia, bleeding, infection, wound problems, bile leak, injury to common bile duct/liver/intestine, possible need for subtotal cholecystectomy or open cholecystectomy.  He seems to understand and agrees to proceed, however he does not want to proceed today. He wants to talk with his brother. He is ok with being admitted and plan for surgery tomorrow. Will start IV rocephin. Ok for clear liquids, NPO after midnight.  FEN - IVF, CLD, NPO after MN VTE - SCDs, lovenox ID - rocephin Foley - none Dispo - Admit to med-surg, observation.   I reviewed ED provider notes, last 24 h vitals and pain scores, last 48 h intake and output, last 24 h labs and trends, and last 24 h imaging results.   Wellington [place], [facility] 01/03/2023, 12:45 PM Please see Amion for pager number during day hours 7:00am-4:30pm

## 2023-01-03 NOTE — Anesthesia Preprocedure Evaluation (Signed)
Anesthesia Evaluation  Patient identified by MRN, date of birth, ID band Patient awake    Reviewed: Allergy & Precautions, H&P , NPO status , Patient's Chart, lab work & pertinent test results  Airway Mallampati: III  TM Distance: >3 FB Neck ROM: Full    Dental no notable dental hx. (+) Teeth Intact, Dental Advisory Given, Implants   Pulmonary neg pulmonary ROS   Pulmonary exam normal breath sounds clear to auscultation       Cardiovascular hypertension, negative cardio ROS Normal cardiovascular exam Rhythm:Regular Rate:Normal     Neuro/Psych  Neuromuscular disease (chronic back pain) negative neurological ROS  negative psych ROS   GI/Hepatic negative GI ROS, Neg liver ROS,,,  Endo/Other  negative endocrine ROS    Renal/GU negative Renal ROSLab Results      Component                Value               Date                      CREATININE               0.90                01/03/2023                BUN                      19                  01/03/2023                NA                       138                 01/03/2023                K                        4.3                 01/03/2023                CL                       104                 01/03/2023                CO2                      20 (L)              01/03/2023             negative genitourinary   Musculoskeletal negative musculoskeletal ROS (+)    Abdominal   Peds negative pediatric ROS (+)  Hematology negative hematology ROS (+) Lab Results      Component                Value               Date  WBC                      13.1 (H)            01/03/2023                HGB                      15.3                01/03/2023                HCT                      45.0                01/03/2023                MCV                      91.9                01/03/2023                PLT                      345                  01/03/2023              Anesthesia Other Findings   Reproductive/Obstetrics negative OB ROS                             Anesthesia Physical Anesthesia Plan  ASA: 2  Anesthesia Plan: General   Post-op Pain Management: Tylenol PO (pre-op)*   Induction: Intravenous  PONV Risk Score and Plan: 3 and Treatment may vary due to age or medical condition, Dexamethasone, Ondansetron and Midazolam  Airway Management Planned: Oral ETT  Additional Equipment: None  Intra-op Plan:   Post-operative Plan: Extubation in OR  Informed Consent: I have reviewed the patients History and Physical, chart, labs and discussed the procedure including the risks, benefits and alternatives for the proposed anesthesia with the patient or authorized representative who has indicated his/her understanding and acceptance.     Dental advisory given  Plan Discussed with:   Anesthesia Plan Comments:        Anesthesia Quick Evaluation

## 2023-01-04 ENCOUNTER — Observation Stay (HOSPITAL_COMMUNITY): Payer: Medicare Other

## 2023-01-04 ENCOUNTER — Observation Stay (HOSPITAL_COMMUNITY): Payer: Medicare Other | Admitting: Anesthesiology

## 2023-01-04 ENCOUNTER — Other Ambulatory Visit: Payer: Self-pay

## 2023-01-04 ENCOUNTER — Encounter (HOSPITAL_COMMUNITY): Admission: EM | Disposition: A | Discharge status: Home / Self Care | Payer: Self-pay | Source: Home / Self Care

## 2023-01-04 ENCOUNTER — Encounter (HOSPITAL_COMMUNITY): Payer: Self-pay

## 2023-01-04 DIAGNOSIS — I708 Atherosclerosis of other arteries: Secondary | ICD-10-CM | POA: Diagnosis present

## 2023-01-04 DIAGNOSIS — Z79899 Other long term (current) drug therapy: Secondary | ICD-10-CM | POA: Diagnosis not present

## 2023-01-04 DIAGNOSIS — E785 Hyperlipidemia, unspecified: Secondary | ICD-10-CM | POA: Diagnosis present

## 2023-01-04 DIAGNOSIS — K8012 Calculus of gallbladder with acute and chronic cholecystitis without obstruction: Secondary | ICD-10-CM | POA: Diagnosis present

## 2023-01-04 DIAGNOSIS — M549 Dorsalgia, unspecified: Secondary | ICD-10-CM | POA: Diagnosis present

## 2023-01-04 DIAGNOSIS — K8000 Calculus of gallbladder with acute cholecystitis without obstruction: Secondary | ICD-10-CM

## 2023-01-04 DIAGNOSIS — I10 Essential (primary) hypertension: Secondary | ICD-10-CM | POA: Diagnosis present

## 2023-01-04 DIAGNOSIS — G8929 Other chronic pain: Secondary | ICD-10-CM | POA: Diagnosis present

## 2023-01-04 DIAGNOSIS — R079 Chest pain, unspecified: Secondary | ICD-10-CM | POA: Diagnosis present

## 2023-01-04 DIAGNOSIS — Z79891 Long term (current) use of opiate analgesic: Secondary | ICD-10-CM | POA: Diagnosis not present

## 2023-01-04 HISTORY — PX: CHOLECYSTECTOMY: SHX55

## 2023-01-04 LAB — COMPREHENSIVE METABOLIC PANEL
ALT: 15 U/L (ref 0–44)
AST: 14 U/L — ABNORMAL LOW (ref 15–41)
Albumin: 3.2 g/dL — ABNORMAL LOW (ref 3.5–5.0)
Alkaline Phosphatase: 49 U/L (ref 38–126)
Anion gap: 7 (ref 5–15)
BUN: 19 mg/dL (ref 8–23)
CO2: 25 mmol/L (ref 22–32)
Calcium: 8.6 mg/dL — ABNORMAL LOW (ref 8.9–10.3)
Chloride: 103 mmol/L (ref 98–111)
Creatinine, Ser: 1.03 mg/dL (ref 0.61–1.24)
GFR, Estimated: >60 mL/min (ref >60)
Glucose, Bld: 144 mg/dL — ABNORMAL HIGH (ref 70–99)
Potassium: 3.8 mmol/L (ref 3.5–5.1)
Sodium: 135 mmol/L (ref 135–145)
Total Bilirubin: 1.1 mg/dL (ref 0.3–1.2)
Total Protein: 6.3 g/dL — ABNORMAL LOW (ref 6.5–8.1)

## 2023-01-04 LAB — CBC
HCT: 38.3 % — ABNORMAL LOW (ref 39.0–52.0)
Hemoglobin: 13.4 g/dL (ref 13.0–17.0)
MCH: 32.9 pg (ref 26.0–34.0)
MCHC: 35 g/dL (ref 30.0–36.0)
MCV: 94.1 fL (ref 80.0–100.0)
Platelets: 266 10*3/uL (ref 150–400)
RBC: 4.07 MIL/uL — ABNORMAL LOW (ref 4.22–5.81)
RDW: 11.9 % (ref 11.5–15.5)
WBC: 14 10*3/uL — ABNORMAL HIGH (ref 4.0–10.5)
nRBC: 0 % (ref 0.0–0.2)

## 2023-01-04 SURGERY — LAPAROSCOPIC CHOLECYSTECTOMY WITH INTRAOPERATIVE CHOLANGIOGRAM
Anesthesia: General | Site: Abdomen

## 2023-01-04 MED ORDER — ROCURONIUM BROMIDE 10 MG/ML (PF) SYRINGE
PREFILLED_SYRINGE | INTRAVENOUS | Status: AC
  Filled 2023-01-04: qty 10

## 2023-01-04 MED ORDER — HEMOSTATIC AGENTS (NO CHARGE) OPTIME
TOPICAL | Status: DC | PRN
  Administered 2023-01-04: 3 via TOPICAL

## 2023-01-04 MED ORDER — PROPOFOL 10 MG/ML IV BOLUS
INTRAVENOUS | Status: DC | PRN
  Administered 2023-01-04: 180 mg via INTRAVENOUS

## 2023-01-04 MED ORDER — HYDROMORPHONE HCL 1 MG/ML IJ SOLN
1.0000 mg | INTRAMUSCULAR | Status: DC | PRN
  Administered 2023-01-04 – 2023-01-06 (×12): 1 mg via INTRAVENOUS
  Filled 2023-01-04 (×12): qty 1

## 2023-01-04 MED ORDER — 0.9 % SODIUM CHLORIDE (POUR BTL) OPTIME
TOPICAL | Status: DC | PRN
  Administered 2023-01-04: 500 mL

## 2023-01-04 MED ORDER — HYDROMORPHONE HCL 1 MG/ML IJ SOLN
0.2500 mg | INTRAMUSCULAR | Status: DC | PRN
  Administered 2023-01-04 (×4): 0.5 mg via INTRAVENOUS

## 2023-01-04 MED ORDER — BUPIVACAINE-EPINEPHRINE (PF) 0.25% -1:200000 IJ SOLN
INTRAMUSCULAR | Status: AC
  Filled 2023-01-04: qty 30

## 2023-01-04 MED ORDER — ACETAMINOPHEN 10 MG/ML IV SOLN: 1000.0000 mg | Freq: Once | INTRAVENOUS | Status: DC | PRN

## 2023-01-04 MED ORDER — DEXAMETHASONE SODIUM PHOSPHATE 10 MG/ML IJ SOLN
INTRAMUSCULAR | Status: DC | PRN
  Administered 2023-01-04: 8 mg via INTRAVENOUS

## 2023-01-04 MED ORDER — SODIUM CHLORIDE 0.9 % IR SOLN
Status: DC | PRN
  Administered 2023-01-04: 1000 mL

## 2023-01-04 MED ORDER — LIDOCAINE 2% (20 MG/ML) 5 ML SYRINGE
INTRAMUSCULAR | Status: DC | PRN
  Administered 2023-01-04: 60 mg via INTRAVENOUS

## 2023-01-04 MED ORDER — LACTATED RINGERS IV SOLN: INTRAVENOUS | Status: DC

## 2023-01-04 MED ORDER — ACETAMINOPHEN 10 MG/ML IV SOLN
INTRAVENOUS | Status: AC
  Filled 2023-01-04: qty 100

## 2023-01-04 MED ORDER — KETOROLAC TROMETHAMINE 30 MG/ML IJ SOLN
INTRAMUSCULAR | Status: AC
  Filled 2023-01-04: qty 1

## 2023-01-04 MED ORDER — FENTANYL CITRATE (PF) 100 MCG/2ML IJ SOLN: 25.0000 ug | INTRAMUSCULAR | Status: DC | PRN

## 2023-01-04 MED ORDER — OXYCODONE HCL 5 MG/5ML PO SOLN: 5.0000 mg | Freq: Once | ORAL | Status: DC | PRN

## 2023-01-04 MED ORDER — ROCURONIUM BROMIDE 10 MG/ML (PF) SYRINGE
PREFILLED_SYRINGE | INTRAVENOUS | Status: DC | PRN
  Administered 2023-01-04: 20 mg via INTRAVENOUS
  Administered 2023-01-04: 70 mg via INTRAVENOUS

## 2023-01-04 MED ORDER — OXYCODONE HCL 5 MG PO TABS
5.0000 mg | ORAL_TABLET | ORAL | Status: DC | PRN
  Administered 2023-01-04: 5 mg via ORAL
  Filled 2023-01-04: qty 1

## 2023-01-04 MED ORDER — LIDOCAINE 2% (20 MG/ML) 5 ML SYRINGE
INTRAMUSCULAR | Status: AC
  Filled 2023-01-04: qty 5

## 2023-01-04 MED ORDER — CHLORHEXIDINE GLUCONATE 0.12 % MT SOLN
OROMUCOSAL | Status: AC
  Administered 2023-01-04: 15 mL via OROMUCOSAL
  Filled 2023-01-04: qty 15

## 2023-01-04 MED ORDER — AMISULPRIDE (ANTIEMETIC) 5 MG/2ML IV SOLN
10.0000 mg | Freq: Once | INTRAVENOUS | Status: AC | PRN
  Administered 2023-01-04: 10 mg via INTRAVENOUS

## 2023-01-04 MED ORDER — ONDANSETRON HCL 4 MG/2ML IJ SOLN: 4.0000 mg | Freq: Once | INTRAMUSCULAR | Status: DC | PRN

## 2023-01-04 MED ORDER — PHENYLEPHRINE 80 MCG/ML (10ML) SYRINGE FOR IV PUSH (FOR BLOOD PRESSURE SUPPORT)
PREFILLED_SYRINGE | INTRAVENOUS | Status: DC | PRN
  Administered 2023-01-04 (×2): 360 ug via INTRAVENOUS
  Administered 2023-01-04: 80 ug via INTRAVENOUS

## 2023-01-04 MED ORDER — DEXAMETHASONE SODIUM PHOSPHATE 10 MG/ML IJ SOLN
INTRAMUSCULAR | Status: AC
  Filled 2023-01-04: qty 1

## 2023-01-04 MED ORDER — ACETAMINOPHEN 10 MG/ML IV SOLN
1000.0000 mg | Freq: Once | INTRAVENOUS | Status: DC | PRN
  Administered 2023-01-04: 1000 mg via INTRAVENOUS

## 2023-01-04 MED ORDER — PROPOFOL 10 MG/ML IV BOLUS
INTRAVENOUS | Status: AC
  Filled 2023-01-04: qty 20

## 2023-01-04 MED ORDER — SUGAMMADEX SODIUM 200 MG/2ML IV SOLN
INTRAVENOUS | Status: DC | PRN
  Administered 2023-01-04: 200 mg via INTRAVENOUS

## 2023-01-04 MED ORDER — BUPIVACAINE-EPINEPHRINE 0.25% -1:200000 IJ SOLN
INTRAMUSCULAR | Status: DC | PRN
  Administered 2023-01-04: 11 mL

## 2023-01-04 MED ORDER — CHLORHEXIDINE GLUCONATE 0.12 % MT SOLN: 15.0000 mL | Freq: Once | OROMUCOSAL | Status: AC

## 2023-01-04 MED ORDER — FENTANYL CITRATE (PF) 250 MCG/5ML IJ SOLN
INTRAMUSCULAR | Status: AC
  Filled 2023-01-04: qty 5

## 2023-01-04 MED ORDER — ORAL CARE MOUTH RINSE: 15.0000 mL | Freq: Once | OROMUCOSAL | Status: AC

## 2023-01-04 MED ORDER — HYDROMORPHONE HCL 1 MG/ML IJ SOLN
INTRAMUSCULAR | Status: AC
  Filled 2023-01-04: qty 1

## 2023-01-04 MED ORDER — ONDANSETRON HCL 4 MG/2ML IJ SOLN
INTRAMUSCULAR | Status: AC
  Filled 2023-01-04: qty 2

## 2023-01-04 MED ORDER — OXYCODONE HCL 5 MG PO TABS
5.0000 mg | ORAL_TABLET | ORAL | Status: DC | PRN
  Administered 2023-01-04 – 2023-01-06 (×7): 10 mg via ORAL
  Filled 2023-01-04 (×7): qty 2

## 2023-01-04 MED ORDER — OXYCODONE HCL 5 MG PO TABS: 5.0000 mg | ORAL_TABLET | Freq: Once | ORAL | Status: DC | PRN

## 2023-01-04 MED ORDER — LISINOPRIL-HYDROCHLOROTHIAZIDE 10-12.5 MG PO TABS: 1.0000 | ORAL_TABLET | Freq: Every day | ORAL | Status: DC

## 2023-01-04 MED ORDER — ONDANSETRON HCL 4 MG/2ML IJ SOLN
INTRAMUSCULAR | Status: DC | PRN
  Administered 2023-01-04: 4 mg via INTRAVENOUS

## 2023-01-04 MED ORDER — FENTANYL CITRATE (PF) 100 MCG/2ML IJ SOLN
INTRAMUSCULAR | Status: DC | PRN
  Administered 2023-01-04 (×2): 50 ug via INTRAVENOUS
  Administered 2023-01-04: 100 ug via INTRAVENOUS

## 2023-01-04 MED ORDER — HEMOSTATIC AGENTS (NO CHARGE) OPTIME
TOPICAL | Status: DC | PRN
  Administered 2023-01-04: 1 via TOPICAL

## 2023-01-04 MED ORDER — AMISULPRIDE (ANTIEMETIC) 5 MG/2ML IV SOLN
INTRAVENOUS | Status: AC
  Filled 2023-01-04: qty 4

## 2023-01-04 SURGICAL SUPPLY — 52 items
ADH SKN CLS APL DERMABOND .7 (GAUZE/BANDAGES/DRESSINGS) ×1
APL PRP STRL LF DISP 70% ISPRP (MISCELLANEOUS) ×1
APL SRG 38 LTWT LNG FL B (MISCELLANEOUS) ×1
APPLICATOR ARISTA FLEXITIP XL (MISCELLANEOUS) IMPLANT
APPLIER CLIP ROT 10 11.4 M/L (STAPLE) ×1
APR CLP MED LRG 11.4X10 (STAPLE) ×1
BAG COUNTER SPONGE SURGICOUNT (BAG) ×1 IMPLANT
BAG SPEC RTRVL 10 TROC 200 (ENDOMECHANICALS) ×1
BAG SPNG CNTER NS LX DISP (BAG)
BLADE CLIPPER SURG (BLADE) IMPLANT
CANISTER SUCT 3000ML PPV (MISCELLANEOUS) ×1 IMPLANT
CHLORAPREP W/TINT 26 (MISCELLANEOUS) ×1 IMPLANT
CLIP APPLIE ROT 10 11.4 M/L (STAPLE) ×1 IMPLANT
CNTNR URN SCR LID CUP LEK RST (MISCELLANEOUS) IMPLANT
CONT SPEC 4OZ STRL OR WHT (MISCELLANEOUS) ×1
COVER MAYO STAND STRL (DRAPES) ×1 IMPLANT
COVER SURGICAL LIGHT HANDLE (MISCELLANEOUS) ×1 IMPLANT
DERMABOND ADVANCED .7 DNX12 (GAUZE/BANDAGES/DRESSINGS) ×1 IMPLANT
DRAPE C-ARM 42X120 X-RAY (DRAPES) ×1 IMPLANT
ELECT REM PT RETURN 9FT ADLT (ELECTROSURGICAL) ×1
ELECTRODE REM PT RTRN 9FT ADLT (ELECTROSURGICAL) ×1 IMPLANT
GLOVE BIO SURGEON STRL SZ8 (GLOVE) ×1 IMPLANT
GLOVE BIOGEL PI IND STRL 8 (GLOVE) ×1 IMPLANT
GOWN STRL REUS W/ TWL LRG LVL3 (GOWN DISPOSABLE) ×2 IMPLANT
GOWN STRL REUS W/ TWL XL LVL3 (GOWN DISPOSABLE) ×1 IMPLANT
GOWN STRL REUS W/TWL LRG LVL3 (GOWN DISPOSABLE) ×2
GOWN STRL REUS W/TWL XL LVL3 (GOWN DISPOSABLE) ×1
HEMOSTAT ARISTA ABSORB 3G PWDR (HEMOSTASIS) IMPLANT
HEMOSTAT SNOW SURGICEL 2X4 (HEMOSTASIS) IMPLANT
IRRIG SUCT STRYKERFLOW 2 WTIP (MISCELLANEOUS) ×1
IRRIGATION SUCT STRKRFLW 2 WTP (MISCELLANEOUS) ×1 IMPLANT
KIT BASIN OR (CUSTOM PROCEDURE TRAY) ×1 IMPLANT
KIT TURNOVER KIT B (KITS) ×1 IMPLANT
NS IRRIG 1000ML POUR BTL (IV SOLUTION) ×1 IMPLANT
PAD ARMBOARD 7.5X6 YLW CONV (MISCELLANEOUS) ×1 IMPLANT
POUCH RETRIEVAL ECOSAC 10 (ENDOMECHANICALS) ×1 IMPLANT
POUCH RETRIEVAL ECOSAC 10MM (ENDOMECHANICALS) ×1
SCISSORS LAP 5X35 DISP (ENDOMECHANICALS) ×1 IMPLANT
SET CHOLANGIOGRAPH 5 50 .035 (SET/KITS/TRAYS/PACK) ×1 IMPLANT
SET TUBE SMOKE EVAC HIGH FLOW (TUBING) ×1 IMPLANT
SLEEVE Z-THREAD 5X100MM (TROCAR) ×1 IMPLANT
SPECIMEN JAR SMALL (MISCELLANEOUS) ×1 IMPLANT
SUT MNCRL AB 4-0 PS2 18 (SUTURE) ×1 IMPLANT
SUT VICRYL 0 UR6 27IN ABS (SUTURE) IMPLANT
TOWEL GREEN STERILE (TOWEL DISPOSABLE) ×1 IMPLANT
TOWEL GREEN STERILE FF (TOWEL DISPOSABLE) ×1 IMPLANT
TRAY LAPAROSCOPIC MC (CUSTOM PROCEDURE TRAY) ×1 IMPLANT
TROCAR 11X100 Z THREAD (TROCAR) ×1 IMPLANT
TROCAR BALLN 12MMX100 BLUNT (TROCAR) ×1 IMPLANT
TROCAR Z-THREAD OPTICAL 5X100M (TROCAR) ×1 IMPLANT
WARMER LAPAROSCOPE (MISCELLANEOUS) ×1 IMPLANT
WATER STERILE IRR 1000ML POUR (IV SOLUTION) ×1 IMPLANT

## 2023-01-04 NOTE — Interval H&P Note (Signed)
History and Physical Interval Note:  01/04/2023 10:39 AM  Sydnee Cabal  has presented today for surgery, with the diagnosis of ACUTE CHOLECYSTITIS.  The various methods of treatment have been discussed with the patient and family. After consideration of risks, benefits and other options for treatment, the patient has consented to  Procedure(s): LAPAROSCOPIC CHOLECYSTECTOMY WITH INTRAOPERATIVE CHOLANGIOGRAM (N/A) as a surgical intervention.  The patient's history has been reviewed, patient examined, no change in status, stable for surgery.  I have reviewed the patient's chart and labs.  Questions were answered to the patient's satisfaction.   The procedure has been discussed with the patient. Operative and non operative treatments have been discussed. Risks of surgery include bleeding, infection,  Common bile duct injury,  Injury to the stomach,liver, colon,small intestine, abdominal wall,  Diaphragm,  Major blood vessels,  And the need for an open procedure.  Other risks include worsening of medical problems, death,  DVT and pulmonary embolism, and cardiovascular events.   Medical options have also been discussed. The patient has been informed of long term expectations of surgery and non surgical options,  The patient agrees to proceed.     [place]

## 2023-01-04 NOTE — Anesthesia Procedure Notes (Signed)
Procedure Name: Intubation Date/Time: 01/04/2023 11:19 AM  Performed by: Leonor Liv, CRNAPre-anesthesia Checklist: Patient identified, Emergency Drugs available, Suction available, Patient being monitored and Timeout performed Patient Re-evaluated:Patient Re-evaluated prior to induction Oxygen Delivery Method: Ambu bag Preoxygenation: Pre-oxygenation with 100% oxygen Induction Type: IV induction Ventilation: Mask ventilation without difficulty and Oral airway inserted - appropriate to patient size Laryngoscope Size: Glidescope and 3 Grade View: Grade I Tube type: Subglottic suction tube Tube size: 7.0 mm Number of attempts: 2 (MAC 4 grade IV view DLx1) Airway Equipment and Method: Stylet and Video-laryngoscopy Placement Confirmation: ETT inserted through vocal cords under direct vision, breath sounds checked- equal and bilateral and CO2 detector Secured at: 23 cm Tube secured with: Tape Dental Injury: Teeth and Oropharynx as per pre-operative assessment

## 2023-01-04 NOTE — Discharge Instructions (Signed)
[facility]  Please arrive at least 30 min before your appointment to complete your check in paperwork.  If you are unable to arrive 30 min prior to your appointment time we may have to cancel or reschedule you. LAPAROSCOPIC SURGERY: POST OP INSTRUCTIONS Always review your discharge instruction sheet given to you by the facility where your surgery was performed. IF YOU HAVE DISABILITY OR FAMILY LEAVE FORMS, YOU MUST BRING THEM TO THE OFFICE FOR PROCESSING.   DO NOT GIVE THEM TO YOUR DOCTOR.  PAIN CONTROL  First take acetaminophen (Tylenol) AND/or ibuprofen (Advil) to control your pain after surgery.  Follow directions on package.  Taking acetaminophen (Tylenol) and/or ibuprofen (Advil) regularly after surgery will help to control your pain and lower the amount of prescription pain medication you may need.  You should not take more than 4,000 mg (4 grams) of acetaminophen (Tylenol) in 24 hours.  You should not take ibuprofen (Advil), aleve, motrin, naprosyn or other NSAIDS if you have a history of stomach ulcers or chronic kidney disease.  A prescription for pain medication may be given to you upon discharge.  Take your pain medication as prescribed, if you still have uncontrolled pain after taking acetaminophen (Tylenol) or ibuprofen (Advil). Use ice packs to help control pain. If you need a refill on your pain medication, please contact your pharmacy.  They will contact our office to request authorization. Prescriptions will not be filled after 5pm or on week-ends.  HOME MEDICATIONS Take your usually prescribed medications unless otherwise directed.  DIET You should follow a light diet the first few days after arrival home.  Be sure to include lots of fluids daily. Avoid fatty, fried foods.   CONSTIPATION It is common to experience some constipation after surgery and if you are taking pain medication.  Increasing fluid intake and taking a stool softener (such as Colace)  will usually help or prevent this problem from occurring.  A mild laxative (Milk of Magnesia or Miralax) should be taken according to package instructions if there are no bowel movements after 48 hours.  WOUND/INCISION CARE Most patients will experience some swelling and bruising in the area of the incisions.  Ice packs will help.  Swelling and bruising can take several days to resolve.  Unless discharge instructions indicate otherwise, follow guidelines below  STERI-STRIPS - you may remove your outer bandages 48 hours after surgery, and you may shower at that time.  You have steri-strips (small skin tapes) in place directly over the incision.  These strips should be left on the skin for 7-10 days.   DERMABOND/SKIN GLUE - you may shower in 24 hours.  The glue will flake off over the next 2-3 weeks. Any sutures or staples will be removed at the office during your follow-up visit.  ACTIVITIES You may resume regular (light) daily activities beginning the next day--such as daily self-care, walking, climbing stairs--gradually increasing activities as tolerated.  You may have sexual intercourse when it is comfortable.  Refrain from any heavy lifting or straining until approved by your doctor. You may drive when you are no longer taking prescription pain medication, you can comfortably wear a seatbelt, and you can safely maneuver your car and apply brakes.  FOLLOW-UP You should see your doctor in the office for a follow-up appointment approximately 2-3 weeks after your surgery.  You should have been given your post-op/follow-up appointment when your surgery was scheduled.  If you did not receive a post-op/follow-up appointment, make sure   that you call for this appointment within a day or two after you arrive home to insure a convenient appointment time.  OTHER INSTRUCTIONS  WHEN TO CALL YOUR DOCTOR: Fever over 101.0 Inability to urinate Continued bleeding from incision. Increased pain, redness, or  drainage from the incision. Increasing abdominal pain  The clinic staff is available to answer your questions during regular business hours.  Please don't hesitate to call and ask to speak to one of the nurses for clinical concerns.  If you have a medical emergency, go to the nearest emergency room or call 911.  A surgeon from [facility] is always on call at the hospital. [address], [address], [place], [place]  [address] ? [address] [address], [place], [place]   [address] [phone] ? [phone] ? FAX [phone]   

## 2023-01-04 NOTE — Transfer of Care (Signed)
Immediate Anesthesia Transfer of Care Note  Patient: Ernest Day  Procedure(s) Performed: LAPAROSCOPIC CHOLECYSTECTOMY (Abdomen)  Patient Location: PACU  Anesthesia Type:General  Level of Consciousness: awake, alert , and oriented  Airway & Oxygen Therapy: Patient Spontanous Breathing and Patient connected to nasal cannula oxygen  Post-op Assessment: Report given to RN  Post vital signs: Reviewed and stable  Last Vitals:  Vitals Value Taken Time  BP 147/83 01/04/23 1304  Temp    Pulse 86 01/04/23 1306  Resp    SpO2 96 % 01/04/23 1306  Vitals shown include unvalidated device data.  Last Pain:  Vitals:   01/04/23 0959  TempSrc:   PainSc: 0-No pain      Patients Stated Pain Goal: 2 ([id number])  Complications: No notable events documented.

## 2023-01-04 NOTE — Anesthesia Postprocedure Evaluation (Signed)
Anesthesia Post Note  Patient: Ernest Day  Procedure(s) Performed: LAPAROSCOPIC CHOLECYSTECTOMY (Abdomen)     Patient location during evaluation: PACU Anesthesia Type: General Level of consciousness: awake and alert Pain management: pain level controlled Vital Signs Assessment: post-procedure vital signs reviewed and stable Respiratory status: spontaneous breathing, nonlabored ventilation, respiratory function stable and patient connected to nasal cannula oxygen Cardiovascular status: blood pressure returned to baseline and stable Postop Assessment: no apparent nausea or vomiting Anesthetic complications: no  No notable events documented.  Last Vitals:  Vitals:   01/04/23 1407 01/04/23 1629  BP: 121/69 (!) 127/58  Pulse: 70 89  Resp: 17 18  Temp: 36.7 C 36.5 C  SpO2: 96% 93%    Last Pain:  Vitals:   01/04/23 1723  TempSrc:   PainSc: 7    Pain Goal: Patients Stated Pain Goal: 2 (01/04/23 0744)                 Barnet Glasgow

## 2023-01-04 NOTE — Op Note (Signed)
Laparoscopic Cholecystectomy  Procedure Note  Indications: This patient presents with symptomatic gallbladder disease and will undergo laparoscopic cholecystectomy.The procedure has been discussed with the patient. Operative and non operative treatments have been discussed. Risks of surgery include bleeding, infection,  Common bile duct injury,  Injury to the stomach,liver, colon,small intestine, abdominal wall,  Diaphragm,  Major blood vessels,  And the need for an open procedure.  Other risks include worsening of medical problems, death,  DVT and pulmonary embolism, and cardiovascular events.   Medical options have also been discussed. The patient has been informed of long term expectations of surgery and non surgical options,  The patient agrees to proceed.     Pre-operative Diagnosis: Calculus of gallbladder with acute cholecystitis, without mention of obstruction  Post-operative Diagnosis: Same  Surgeon: Turner Daniels  MD   Assistants: OR staff   Anesthesia: General endotracheal anesthesia and Local anesthesia 0.25.% bupivacaine, with epinephrine  ASA Class: 2  Procedure Details  The patient was seen again in the Holding Room. The risks, benefits, complications, treatment options, and expected outcomes were discussed with the patient. The possibilities of reaction to medication, pulmonary aspiration, perforation of viscus, bleeding, recurrent infection, finding a normal gallbladder, the need for additional procedures, failure to diagnose a condition, the possible need to convert to an open procedure, and creating a complication requiring transfusion or operation were discussed with the patient. The patient and/or family concurred with the proposed plan, giving informed consent. The site of surgery properly noted/marked. The patient was taken to Operating Room, identified as Ernest Day and the procedure verified as Laparoscopic Cholecystectomy with Intraoperative Cholangiograms. A Time  Out was held and the above information confirmed.  Prior to the induction of general anesthesia, antibiotic prophylaxis was administered. General endotracheal anesthesia was then administered and tolerated well. After the induction, the abdomen was prepped in the usual sterile fashion. The patient was positioned in the supine position with the left arm comfortably tucked, along with some reverse Trendelenburg.  Local anesthetic agent was injected into the skin near the umbilicus and an incision made. The midline fascia was incised and the Hasson technique was used to introduce a 12 mm port under direct vision. It was secured with a figure of eight Vicryl suture placed in the usual fashion. Pneumoperitoneum was then created with CO2 and tolerated well without any adverse changes in the patient's vital signs. Additional trocars were introduced under direct vision with an 11 mm trocar in the epigastrium and 2 5 mm trocars in the right upper quadrant. All skin incisions were infiltrated with a local anesthetic agent before making the incision and placing the trocars.   The gallbladder was identified, the fundus grasped and retracted cephalad. Adhesions were lysed bluntly and with the electrocautery where indicated, taking care not to injure any adjacent organs or viscus. The infundibulum was grasped and retracted laterally, exposing the peritoneum overlying the triangle of Calot. This was then divided and exposed in a blunt fashion. The cystic duct was clearly identified and bluntly dissected circumferentially. The junctions of the gallbladder, cystic duct and common bile duct were clearly identified prior to the division of any linear structure.   There is severe significant inflammation.  The cystic duct was quite small and tenuous.  His would not accommodate a catheter.  The critical view was obtained.  This was only tubular structure entering the gallbladder.  I cleared an area behind it for about a  centimeter and saw no other tubular structures.  Clips were placed on the gallbladder side as well as cystic duct side and divided at the junction of the infundibulum.  There is severe inflammation of the gallbladder with early signs of gangrene.  There were dense adhesions to the gallbladder to the omentum.  Duodenum was visualized and preserved.  The cystic artery was identified, dissected free, ligated with clips and divided as well. Posterior cystic artery clipped and divided.  The gallbladder was dissected from the liver bed in retrograde fashion with the electrocautery. The gallbladder was removed. The liver bed was irrigated and inspected. Hemostasis was achieved with the electrocautery Arista and Surgicel snow.. Copious irrigation was utilized and was repeatedly aspirated until clear all particulate matter. Hemostasis was achieved with no signs  Of bleeding or bile leakage.  Pneumoperitoneum was completely reduced after viewing removal of the trocars under direct vision. The wound was thoroughly irrigated and the fascia was then closed with a figure of eight suture; the skin was then closed with 4 O monocryl  and a sterile dressing was applied.  Instrument, sponge, and needle counts were correct at closure and at the conclusion of the case.   Findings: Cholecystitis with Cholelithiasis  Estimated Blood Loss: less than 100 mL         Drains: none         Total IV Fluids per record          Specimens: Gallbladder           Complications: None; patient tolerated the procedure well.         Disposition: PACU - hemodynamically stable.         Condition: stable

## 2023-01-05 ENCOUNTER — Encounter (HOSPITAL_COMMUNITY): Payer: Self-pay | Admitting: Surgery

## 2023-01-05 LAB — CBC
HCT: 35.8 % — ABNORMAL LOW (ref 39.0–52.0)
Hemoglobin: 12 g/dL — ABNORMAL LOW (ref 13.0–17.0)
MCH: 32.8 pg (ref 26.0–34.0)
MCHC: 33.5 g/dL (ref 30.0–36.0)
MCV: 97.8 fL (ref 80.0–100.0)
Platelets: 248 10*3/uL (ref 150–400)
RBC: 3.66 MIL/uL — ABNORMAL LOW (ref 4.22–5.81)
RDW: 11.9 % (ref 11.5–15.5)
WBC: 13.4 10*3/uL — ABNORMAL HIGH (ref 4.0–10.5)
nRBC: 0 % (ref 0.0–0.2)

## 2023-01-05 LAB — SURGICAL PATHOLOGY

## 2023-01-05 LAB — COMPREHENSIVE METABOLIC PANEL
ALT: 37 U/L (ref 0–44)
AST: 47 U/L — ABNORMAL HIGH (ref 15–41)
Albumin: 2.9 g/dL — ABNORMAL LOW (ref 3.5–5.0)
Alkaline Phosphatase: 43 U/L (ref 38–126)
Anion gap: 12 (ref 5–15)
BUN: 19 mg/dL (ref 8–23)
CO2: 24 mmol/L (ref 22–32)
Calcium: 8.2 mg/dL — ABNORMAL LOW (ref 8.9–10.3)
Chloride: 98 mmol/L (ref 98–111)
Creatinine, Ser: 1.14 mg/dL (ref 0.61–1.24)
GFR, Estimated: >60 mL/min (ref >60)
Glucose, Bld: 164 mg/dL — ABNORMAL HIGH (ref 70–99)
Potassium: 4.4 mmol/L (ref 3.5–5.1)
Sodium: 134 mmol/L — ABNORMAL LOW (ref 135–145)
Total Bilirubin: 0.9 mg/dL (ref 0.3–1.2)
Total Protein: 6 g/dL — ABNORMAL LOW (ref 6.5–8.1)

## 2023-01-05 MED ORDER — FENTANYL CITRATE PF 50 MCG/ML IJ SOSY
100.0000 ug | PREFILLED_SYRINGE | INTRAMUSCULAR | Status: DC | PRN
  Administered 2023-01-05 (×2): 100 ug via INTRAVENOUS
  Filled 2023-01-05 (×2): qty 2

## 2023-01-05 NOTE — Progress Notes (Signed)
   01/05/23 1000  Mobility  Activity Ambulated independently in hallway  Level of Assistance Independent  Assistive Device None  Distance Ambulated (ft) 550 ft  Activity Response Tolerated well  Mobility Referral Yes  $Mobility charge 1 Mobility   Mobility Specialist Progress Note  Pt was in bed and agreeable. Had c/o abd pain w/ a rating of 9/10. Returned to bed w/ all needs met and call bell in reach.   Anastasia Pall Mobility Specialist  Please contact via SecureChat or Rehab office at [phone]

## 2023-01-05 NOTE — Progress Notes (Signed)
1 Day Post-Op   Subjective/Chief Complaint: Pt complains of pain especially around the umbilicus  Does take narcotics for back pain at home    Objective: Vital signs in last 24 hours: Temp:  [97.6 F (36.4 C)-99.1 F (37.3 C)] 98.2 F (36.8 C) (04/05 0806) Pulse Rate:  [63-89] 77 (04/05 0806) Resp:  [13-18] 18 (04/05 0806) BP: (114-149)/(58-85) 125/85 (04/05 0806) SpO2:  [90 %-96 %] 96 % (04/05 0806) Weight:  [95.3 kg-99.5 kg] 99.5 kg (04/05 0512) Last BM Date : 01/03/23  Intake/Output from previous day: 04/04 0701 - 04/05 0700 In: 1200.6 [I.V.:1100.6; IV Piggyback:100] Out: 5 [Blood:5] Intake/Output this shift: No intake/output data recorded.  General appearance: alert Resp: clear to auscultation bilaterally Cardio: nsr Incision/Wound: ort sites CDI bruising around umbilicus   Lab Results:  Recent Labs    01/04/23 0346 01/05/23 0251  WBC 14.0* 13.4*  HGB 13.4 12.0*  HCT 38.3* 35.8*  PLT 266 248   BMET Recent Labs    01/03/23 1000 01/03/23 1007 01/04/23 0346  NA 136 138 135  K 4.2 4.3 3.8  CL 102 104 103  CO2 20*  --  25  GLUCOSE 183* 184* 144*  BUN 17 19 19   CREATININE 1.15 0.90 1.03  CALCIUM 10.0  --  8.6*   PT/INR No results for input(s): "LABPROT", "INR" in the last 72 hours. ABG No results for input(s): "PHART", "HCO3" in the last 72 hours.  Invalid input(s): "PCO2", "PO2"  Studies/Results: US Abdomen Limited  Result Date: 01/03/2023 CLINICAL DATA:  Right upper quadrant pain EXAM: ULTRASOUND ABDOMEN LIMITED RIGHT UPPER QUADRANT COMPARISON:  Same day CT FINDINGS: Gallbladder: Multiple echogenic, shadowing stones, largest measuring 2.4 cm in diameter. No gallstones or pericholecystic fluid visualized. Sonographer reported significant transducer pressure tenderness on exam. Common bile duct: Diameter: 12 mm. Liver: Left hepatic lobe largely obscured by shadowing bowel gas. No focal lesion identified. Increased hepatic parenchymal echogenicity.  Portal vein is patent on color Doppler imaging with normal direction of blood flow towards the liver. Other: None. IMPRESSION: 1. Cholelithiasis with transducer pressure tenderness on exam, however no gallbladder wall thickening was visualized. Findings are equivocal for acute cholecystitis. 2. Dilated common bile duct measuring up to 12 mm. Correlate with liver function tests. 3. The echogenicity of the liver is increased. This is a nonspecific finding but is most commonly seen with fatty infiltration of the liver. There are no obvious focal liver lesions. 4. Left hepatic lobe largely obscured by shadowing bowel gas. Electronically Signed   By: Duanne Guess D.O.   On: 01/03/2023 12:07   CT Angio Chest/Abd/Pel for Dissection W and/or Wo Contrast  Result Date: 01/03/2023 CLINICAL DATA:  Chest pain.  Concern for aortic dissection. EXAM: CT ANGIOGRAPHY CHEST, ABDOMEN AND PELVIS TECHNIQUE: Non-contrast CT of the chest was initially obtained. Multidetector CT imaging through the chest, abdomen and pelvis was performed using the standard protocol during bolus administration of intravenous contrast. Multiplanar reconstructed images and MIPs were obtained and reviewed to evaluate the vascular anatomy. RADIATION DOSE REDUCTION: This exam was performed according to the departmental dose-optimization program which includes automated exposure control, adjustment of the mA and/or kV according to patient size and/or use of iterative reconstruction technique. CONTRAST:  106mL OMNIPAQUE IOHEXOL 350 MG/ML SOLN COMPARISON:  Chest x-ray from same day. FINDINGS: CTA CHEST FINDINGS Cardiovascular: Preferential opacification of the thoracic aorta. No evidence of thoracic aortic aneurysm or dissection. Coronary, aortic arch, and branch vessel atherosclerotic vascular disease. Normal heart size. No pericardial  effusion. No central pulmonary embolism. Mediastinum/Nodes: No enlarged mediastinal, hilar, or axillary lymph nodes. Thyroid  gland, trachea, and esophagus demonstrate no significant findings. Small tracheal diverticulum. Lungs/Pleura: Scattered mild bibasilar subsegmental atelectasis and linear scarring. No focal consolidation, pleural effusion, or pneumothorax. Musculoskeletal: No chest wall abnormality. No acute or significant osseous findings. Old healed right clavicle and anterior third through fifth rib fractures. Review of the MIP images confirms the above findings. CTA ABDOMEN AND PELVIS FINDINGS VASCULAR Aorta: Normal caliber aorta without aneurysm, dissection, vasculitis or significant stenosis. Extensive calcified and noncalcified atherosclerotic plaque. 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 SMA: Patent without evidence of aneurysm, dissection, vasculitis or significant stenosis. 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 Renals: Two left and single right renal arteries are patent without evidence of aneurysm, dissection, vasculitis, fibromuscular dysplasia or significant stenosis. IMA: Patent without evidence of aneurysm, dissection, vasculitis or significant stenosis. Inflow: Patent without evidence of aneurysm, dissection, vasculitis or significant stenosis. Veins: No obvious venous abnormality within the limitations of this arterial phase study. Review of the MIP images confirms the above findings. NON-VASCULAR Hepatobiliary: No focal liver abnormality. Multiple large gallstones. No gallbladder wall thickening. Moderate common bile duct dilatation up to 1.2 cm, without clear obstructing gallstone or mass. Pancreas: Unremarkable. No pancreatic ductal dilatation or surrounding inflammatory changes. Spleen: Normal in size without focal abnormality. Adrenals/Urinary Tract: Adrenal glands are unremarkable. Kidneys are normal, without renal calculi, focal lesion, or hydronephrosis. Bladder is  unremarkable. Stomach/Bowel: Stomach is within normal limits. Appendix appears normal. No evidence of bowel wall thickening, distention, or inflammatory changes. Mild left-sided colonic diverticulosis. Lymphatic: No enlarged abdominal or pelvic lymph nodes. Reproductive: Prostate is unremarkable. Other: No free fluid or pneumoperitoneum. Musculoskeletal: No acute or significant osseous findings. Review of the MIP images confirms the above findings. IMPRESSION: 1. No evidence of aortic aneurysm or dissection. 2. 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 4. Cholelithiasis. Moderate common bile duct dilatation up to 1.2 cm, without clear obstructing gallstone or mass. Correlate with LFTs. 5.  Aortic atherosclerosis (ICD10-I70.0). Electronically Signed   By: Obie DredgeWilliam T Derry M.D.   On: 01/03/2023 11:29   DG Chest 2 View  Result Date: 01/03/2023 CLINICAL DATA:  Lambert ModySharp left-sided chest pain EXAM: CHEST - 2 VIEW COMPARISON:  12/31/2013 FINDINGS: Artifact overlies the chest. Chronic relative elevation of the right hemidiaphragm. Heart size is normal. Mediastinal shadows are normal. The lungs are clear. Pulmonary vascularity is normal. No effusions. No abnormal bone finding. IMPRESSION: No active cardiopulmonary disease. Chronic relative elevation of the right hemidiaphragm. Electronically Signed   By: Paulina FusiMark  Shogry M.D.   On: 01/03/2023 10:56    Anti-infectives: Anti-infectives (From admission, onward)    Start     Dose/Rate Route Frequency Ordered Stop   01/03/23 1445  cefTRIAXone (ROCEPHIN) 2 g in sodium chloride 0.9 % 100 mL IVPB        2 g 200 mL/hr over 30 Minutes Intravenous Every 24 hours 01/03/23 1433 01/10/23 1444       Assessment/Plan: s/p Procedure(s): LAPAROSCOPIC CHOLECYSTECTOMY (N/A) Adj pain meds  Ambulate  D/c am sat   LOS: 1 day    Dortha Schwalbehomas  A Zacary Bauer MD  01/05/2023

## 2023-01-06 MED ORDER — METHOCARBAMOL 500 MG PO TABS: 500.0000 mg | ORAL_TABLET | Freq: Three times a day (TID) | ORAL | 1 refills | Status: DC | PRN

## 2023-01-06 MED ORDER — OXYCODONE HCL 5 MG PO TABS: 5.0000 mg | ORAL_TABLET | ORAL | 0 refills | Status: DC | PRN

## 2023-01-06 MED ORDER — ONDANSETRON 4 MG PO TBDP: 4.0000 mg | ORAL_TABLET | Freq: Four times a day (QID) | ORAL | 0 refills | Status: DC | PRN

## 2023-01-06 NOTE — Progress Notes (Signed)
2 Days Post-Op   Subjective/Chief Complaint: Slept well last night, eating fine, voiding, up and around, pain much better controlled   Objective: Vital signs in last 24 hours: Temp:  [98 F (36.7 C)-99 F (37.2 C)] 98.7 F (37.1 C) (04/06 0718) Pulse Rate:  [63-77] 77 (04/06 0718) Resp:  [15-17] 16 (04/06 0718) BP: (100-115)/(52-61) 113/55 (04/06 0718) SpO2:  [94 %-100 %] 94 % (04/06 0718) Last BM Date : 01/04/23  Intake/Output from previous day: 04/05 0701 - 04/06 0700 In: 720 [P.O.:720] Out: -  Intake/Output this shift: No intake/output data recorded.  General nad in clothes Pulm effort normal Ab soft approp tender some ecchymosis at umbilicus, no infection  Lab Results:  Recent Labs    01/04/23 0346 01/05/23 0251  WBC 14.0* 13.4*  HGB 13.4 12.0*  HCT 38.3* 35.8*  PLT 266 248   BMET Recent Labs    01/04/23 0346 01/05/23 0251  NA 135 134*  K 3.8 4.4  CL 103 98  CO2 25 24  GLUCOSE 144* 164*  BUN 19 19  CREATININE 1.03 1.14  CALCIUM 8.6* 8.2*   PT/INR No results for input(s): "LABPROT", "INR" in the last 72 hours. ABG No results for input(s): "PHART", "HCO3" in the last 72 hours.  Invalid input(s): "PCO2", "PO2"  Studies/Results: No results found.  Anti-infectives: Anti-infectives (From admission, onward)    Start     Dose/Rate Route Frequency Ordered Stop   01/03/23 1445  cefTRIAXone (ROCEPHIN) 2 g in sodium chloride 0.9 % 100 mL IVPB        2 g 200 mL/hr over 30 Minutes Intravenous Every 24 hours 01/03/23 1433 01/10/23 1444       Assessment/Plan: POD 2 lap chole -pain controlled, tol diet, vitals fine, wbc up still but no indication for abx and this is likely just post surgery -he can go home today  Emelia Loron 01/06/2023

## 2023-01-06 NOTE — Progress Notes (Signed)
Discharge instructions are given to the pt. All questions answered.  

## 2023-01-09 NOTE — Discharge Summary (Signed)
[facility] Discharge Summary   Patient ID: Ernest Day MRN: [id number] DOB/AGE: [birth year] 72 y.o.  Admit date: 01/03/2023 Discharge date: 01/09/2023  Admitting Diagnosis: Acute cholecystitis  Discharge Diagnosis Patient Active Problem List   Diagnosis Date Noted   Acute cholecystitis 01/03/2023    Consultants None  Imaging: No results found.  Procedures Dr. Luisa Hart (01/04/2023) - Laparoscopic Cholecystectomy   Hospital Course:  Ernest Day is a 72 y.o. male with a history of hypertension, hyperlipidemia and chronic back pain on Norco who presented to the ED 4/3 for abdominal pain. Patient was worked up by EDP and found to have WBC 13.1. Lipase and LFT's wnl. Tn wnl. 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 Also noted to have cholelithiasis with CBD dilation to 1.2 cm without obvious choledocholithiasis.  RUQ [place] with multiple gallstones measuring up to 2.4 cm without gallbladder wall thickening or pericholecystic fluid.  CBD 12 mm. Concern for cholecystitis and general surgery was asked to see. Patient was admitted and underwent procedure listed above. Intraoperatively he was found to have severe significant inflammation of the gallbladder, and the cystic duct was quite small and tenuous therefore unable to perform IOC. Patient tolerated procedure well and was transferred to the floor.  Given his chronic pain/ narcotic use pain control was initially difficult but did improve with multimodal therapies. Diet was advanced as tolerated.  On POD2 the patient was felt stable for discharge home.  Patient will follow up as below and knows to call with questions or concerns.     Allergies as of 01/06/2023   No Known Allergies      Medication List     TAKE these medications    HYDROcodone-acetaminophen 10-325 MG  tablet Commonly known as: NORCO Take 1 tablet by mouth 2 (two) times daily as needed.   HYDROcodone-acetaminophen 10-325 MG tablet Commonly known as: NORCO Take 1 tablet by mouth 2 (two) times daily as needed.   HYDROcodone-acetaminophen 10-325 MG tablet Commonly known as: NORCO Take 1 tablet by mouth 2 (two) times daily as needed.   HYDROcodone-acetaminophen 10-325 MG tablet Commonly known as: NORCO Take 1 tablet by mouth 2 (two) times daily as needed.   HYDROcodone-acetaminophen 10-325 MG tablet Commonly known as: NORCO Take 1 tablet by mouth 2 (two) times daily as needed.   lisinopril-hydrochlorothiazide 10-12.5 MG tablet Commonly known as: ZESTORETIC Take 1 tablet by mouth daily.   lisinopril-hydrochlorothiazide 10-12.5 MG tablet Commonly known as: ZESTORETIC Take 1 tablet by mouth daily.   methocarbamol 500 MG tablet Commonly known as: ROBAXIN Take 1 tablet (500 mg total) by mouth every 8 (eight) hours as needed for muscle spasms.   ondansetron 4 MG disintegrating tablet Commonly known as: ZOFRAN-ODT Take 1 tablet (4 mg total) by mouth every 6 (six) hours as needed for nausea.   oxyCODONE 5 MG immediate release tablet Commonly known as: Oxy IR/ROXICODONE Take 1 tablet (5 mg total) by mouth every 4 (four) hours as needed for moderate pain or severe pain.   oxyCODONE-acetaminophen 10-325 MG tablet Commonly known as: PERCOCET Take 1 tablet by mouth 2 (two) times daily as needed.   oxyCODONE-acetaminophen 10-325 MG tablet Commonly known as: PERCOCET Take 1 tablet by mouth 2 (two) times daily as needed.   oxyCODONE-acetaminophen 10-325 MG tablet Commonly known as: PERCOCET Take 1 tablet by mouth 2 (two) times daily as needed   rosuvastatin 20  MG tablet Commonly known as: Crestor Take 1 tablet (20 mg total) by mouth at bedtime.          Follow-up Information     Maczis, Hedda Slade, [place]. Go on 01/30/2023.   Specialty: General Surgery Why: Your  appointment is 01/30/23 at 9 am Arrive early to check in, fill out paperwork, Bring photo ID and insurance information Contact information: [address] [place] [address] [place] [address] [phone]                  Signed: Franne Forts, [facility] 01/09/2023, 10:41 AM Please see Amion for pager number during day hours 7:00am-4:30pm

## 2023-01-10 ENCOUNTER — Encounter (HOSPITAL_COMMUNITY): Payer: Self-pay

## 2023-01-10 ENCOUNTER — Emergency Department (HOSPITAL_COMMUNITY)
Admission: EM | Admit: 2023-01-10 | Discharge: 2023-01-10 | Disposition: A | Discharge status: Home / Self Care | Payer: Medicare Other | Attending: Emergency Medicine | Admitting: Emergency Medicine

## 2023-01-10 ENCOUNTER — Emergency Department (HOSPITAL_COMMUNITY): Payer: Medicare Other

## 2023-01-10 ENCOUNTER — Other Ambulatory Visit: Payer: Self-pay

## 2023-01-10 DIAGNOSIS — R109 Unspecified abdominal pain: Secondary | ICD-10-CM | POA: Diagnosis present

## 2023-01-10 DIAGNOSIS — R1033 Periumbilical pain: Secondary | ICD-10-CM | POA: Diagnosis not present

## 2023-01-10 DIAGNOSIS — R1084 Generalized abdominal pain: Secondary | ICD-10-CM

## 2023-01-10 LAB — CBC WITH DIFFERENTIAL/PLATELET
Abs Immature Granulocytes: 0.04 10*3/uL (ref 0.00–0.07)
Basophils Absolute: 0 10*3/uL (ref 0.0–0.1)
Basophils Relative: 1 %
Eosinophils Absolute: 0.1 10*3/uL (ref 0.0–0.5)
Eosinophils Relative: 1 %
HCT: 37.6 % — ABNORMAL LOW (ref 39.0–52.0)
Hemoglobin: 12.8 g/dL — ABNORMAL LOW (ref 13.0–17.0)
Immature Granulocytes: 1 %
Lymphocytes Relative: 23 %
Lymphs Abs: 1.9 10*3/uL (ref 0.7–4.0)
MCH: 33.1 pg (ref 26.0–34.0)
MCHC: 34 g/dL (ref 30.0–36.0)
MCV: 97.2 fL (ref 80.0–100.0)
Monocytes Absolute: 0.7 10*3/uL (ref 0.1–1.0)
Monocytes Relative: 9 %
Neutro Abs: 5.5 10*3/uL (ref 1.7–7.7)
Neutrophils Relative %: 65 %
Platelets: 345 10*3/uL (ref 150–400)
RBC: 3.87 MIL/uL — ABNORMAL LOW (ref 4.22–5.81)
RDW: 11.7 % (ref 11.5–15.5)
WBC: 8.3 10*3/uL (ref 4.0–10.5)
nRBC: 0 % (ref 0.0–0.2)

## 2023-01-10 LAB — COMPREHENSIVE METABOLIC PANEL
ALT: 51 U/L — ABNORMAL HIGH (ref 0–44)
AST: 27 U/L (ref 15–41)
Albumin: 3.1 g/dL — ABNORMAL LOW (ref 3.5–5.0)
Alkaline Phosphatase: 90 U/L (ref 38–126)
Anion gap: 9 (ref 5–15)
BUN: 10 mg/dL (ref 8–23)
CO2: 24 mmol/L (ref 22–32)
Calcium: 8.7 mg/dL — ABNORMAL LOW (ref 8.9–10.3)
Chloride: 103 mmol/L (ref 98–111)
Creatinine, Ser: 0.97 mg/dL (ref 0.61–1.24)
GFR, Estimated: >60 mL/min (ref >60)
Glucose, Bld: 108 mg/dL — ABNORMAL HIGH (ref 70–99)
Potassium: 4.5 mmol/L (ref 3.5–5.1)
Sodium: 136 mmol/L (ref 135–145)
Total Bilirubin: 0.6 mg/dL (ref 0.3–1.2)
Total Protein: 7 g/dL (ref 6.5–8.1)

## 2023-01-10 LAB — LACTIC ACID, PLASMA: Lactic Acid, Venous: 1.2 mmol/L (ref 0.5–1.9)

## 2023-01-10 LAB — LIPASE, BLOOD: Lipase: 80 U/L — ABNORMAL HIGH (ref 11–51)

## 2023-01-10 MED ORDER — HYDROMORPHONE HCL 1 MG/ML IJ SOLN
1.0000 mg | Freq: Once | INTRAMUSCULAR | Status: AC
  Administered 2023-01-10: 1 mg via INTRAVENOUS
  Filled 2023-01-10: qty 1

## 2023-01-10 MED ORDER — ONDANSETRON HCL 4 MG/2ML IJ SOLN
4.0000 mg | Freq: Once | INTRAMUSCULAR | Status: AC
  Administered 2023-01-10: 4 mg via INTRAVENOUS
  Filled 2023-01-10: qty 2

## 2023-01-10 MED ORDER — AMOXICILLIN-POT CLAVULANATE 875-125 MG PO TABS: 1.0000 | ORAL_TABLET | Freq: Two times a day (BID) | ORAL | 0 refills | Status: AC

## 2023-01-10 MED ORDER — OXYCODONE HCL 10 MG PO TABS: 10.0000 mg | ORAL_TABLET | ORAL | 0 refills | Status: DC | PRN

## 2023-01-10 MED ORDER — SODIUM CHLORIDE 0.9 % IV BOLUS
1000.0000 mL | Freq: Once | INTRAVENOUS | Status: AC
  Administered 2023-01-10: 1000 mL via INTRAVENOUS

## 2023-01-10 MED ORDER — IOHEXOL 350 MG/ML SOLN
75.0000 mL | Freq: Once | INTRAVENOUS | Status: AC | PRN
  Administered 2023-01-10: 75 mL via INTRAVENOUS

## 2023-01-10 MED ORDER — OXYCODONE-ACETAMINOPHEN 5-325 MG PO TABS
2.0000 | ORAL_TABLET | Freq: Once | ORAL | Status: AC
  Administered 2023-01-10: 2 via ORAL
  Filled 2023-01-10: qty 2

## 2023-01-10 NOTE — ED Notes (Signed)
Pt would not let this RN stick him for an IV more than once, IV consult place while he was in triage. Pt warned this could delay care, IV team consult placed.

## 2023-01-10 NOTE — ED Notes (Signed)
Patient transported to CT 

## 2023-01-10 NOTE — ED Provider Triage Note (Signed)
Emergency Medicine Provider Triage Evaluation Note  Ernest Day , a 72 y.o. male  was evaluated in triage.  Pt complains of post op pain. Report having gangrenous gallbladder removed 4 days ago and have been having postop pain not adequately control with pain medication at home.  Denies fever, cp, sob.  Does endorse some nausea. Surgeon is Dr. Luisa Hart  Review of Systems  Positive: As above Negative: As above  Physical Exam  BP (!) 167/87 (BP Location: Right Arm)   Pulse 70   Temp 98.1 F (36.7 C) (Oral)   Resp 18   SpO2 100%  Gen:   Awake, no distress   Resp:  Normal effort  MSK:   Moves extremities without difficulty  Other:  Erythema at laparoscopic site  Medical Decision Making  Medically screening exam initiated at 10:34 AM.  Appropriate orders placed.  Ernest Day was informed that the remainder of the evaluation will be completed by another provider, this initial triage assessment does not replace that evaluation, and the importance of remaining in the ED until their evaluation is complete.     Ernest Helper, PA-C 01/10/23 1039

## 2023-01-10 NOTE — ED Provider Notes (Signed)
Signout from Dr. Adela Lank.  72 year old male with abdominal pain and poor p.o. intake, constipation.  Recent lap chole and has some signs of cellulitis near port site.  Plan is to follow-up on results of labs and imaging and consult general surgery Physical Exam  BP (!) 140/69   Pulse 60   Temp 98 F (36.7 C)   Resp 18   SpO2 100%   Physical Exam  Procedures  Procedures  ED Course / MDM    Medical Decision Making Risk Prescription drug management.   Patient was seen by the general surgery team including Dr. Dwain Sarna.  They are going to be sending him some prescriptions for pain medicine and antibiotics and will see him in the clinic for follow-up.       Terrilee Files, MD 01/11/23 [phone]

## 2023-01-10 NOTE — ED Notes (Signed)
Dr. Wakefield at bedside. 
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
## 2023-01-10 NOTE — ED Triage Notes (Signed)
Pt came in via POV d/t post-op problems after having his gangrenous gallbladder removed 4 days ago (per pt). States that he called the office for the surgeon who did the procedure & was led to come in for evaluation d/t his post-op pain worsening & pain control at home has not been helping him at home. Pt reports 10/10 pain mainly in  Rt side abd, under his sternum & bellow his belly button. States his BP has been elevated since the surgery as well.

## 2023-01-10 NOTE — ED Provider Notes (Signed)
[place] [facility]   CSN: [id number] Arrival date & time: 01/10/23  [id number]     History  No chief complaint on file.   Eugenia McalpineRobert Zirbel is a 72 y.o. male.  72 yo M with a chief complaints of abdominal pain.  Patient had his gallbladder removed about 5 days ago and since then has had continued excruciating abdominal discomfort.  Not really able to eat or drink.  Passing some flatus but denies any significant bowel movements.  He denies fevers.  Has had some redness around one of his lower surgical incisions.        Home Medications Prior to Admission medications   Medication Sig Start Date End Date Taking? Authorizing Provider  HYDROcodone-acetaminophen (NORCO) 10-325 MG tablet Take 1 tablet by mouth 2 (two) times daily as needed. Patient not taking: Reported on 01/03/2023 07/25/22     HYDROcodone-acetaminophen (NORCO) 10-325 MG tablet Take 1 tablet by mouth 2 (two) times daily as needed. Patient not taking: Reported on 01/03/2023 07/25/22     HYDROcodone-acetaminophen (NORCO) 10-325 MG tablet Take 1 tablet by mouth 2 (two) times daily as needed. Patient not taking: Reported on 01/03/2023 07/25/22     HYDROcodone-acetaminophen (NORCO) 10-325 MG tablet Take 1 tablet by mouth 2 (two) times daily as needed. Patient not taking: Reported on 01/03/2023 08/23/22     HYDROcodone-acetaminophen (NORCO) 10-325 MG tablet Take 1 tablet by mouth 2 (two) times daily as needed. 12/22/22     lisinopril-hydrochlorothiazide (ZESTORETIC) 10-12.5 MG tablet Take 1 tablet by mouth daily. Patient not taking: Reported on 01/03/2023 08/23/22     lisinopril-hydrochlorothiazide (ZESTORETIC) 10-12.5 MG tablet Take 1 tablet by mouth daily. 12/22/22     methocarbamol (ROBAXIN) 500 MG tablet Take 1 tablet (500 mg total) by mouth every 8 (eight) hours as needed for muscle spasms. 01/06/23   Emelia LoronWakefield, Matthew, MD  ondansetron (ZOFRAN-ODT) 4 MG disintegrating tablet Take 1 tablet  (4 mg total) by mouth every 6 (six) hours as needed for nausea. 01/06/23   Emelia LoronWakefield, Matthew, MD  oxyCODONE (OXY IR/ROXICODONE) 5 MG immediate release tablet Take 1 tablet (5 mg total) by mouth every 4 (four) hours as needed for moderate pain or severe pain. 01/06/23   Emelia LoronWakefield, Matthew, MD  oxyCODONE-acetaminophen (PERCOCET) 10-325 MG tablet Take 1 tablet by mouth 2 (two) times daily as needed. Patient not taking: Reported on 01/03/2023 08/23/22     oxyCODONE-acetaminophen (PERCOCET) 10-325 MG tablet Take 1 tablet by mouth 2 (two) times daily as needed. Patient not taking: Reported on 01/03/2023 09/20/22     oxyCODONE-acetaminophen (PERCOCET) 10-325 MG tablet Take 1 tablet by mouth 2 (two) times daily as needed Patient not taking: Reported on 01/03/2023 10/24/22     rosuvastatin (CRESTOR) 20 MG tablet Take 1 tablet (20 mg total) by mouth at bedtime. Patient not taking: Reported on 01/03/2023 08/23/22         Allergies    Patient has no known allergies.    Review of Systems   Review of Systems  Physical Exam Updated Vital Signs BP (!) 140/69   Pulse 60   Temp 98 F (36.7 C)   Resp 18   SpO2 100%  Physical Exam Vitals and nursing note reviewed.  Constitutional:      Appearance: He is well-developed.  HENT:     Head: Normocephalic and atraumatic.  Eyes:     Pupils: Pupils are equal, round, and reactive to light.  Neck:  Vascular: No JVD.  Cardiovascular:     Rate and Rhythm: Normal rate and regular rhythm.     Heart sounds: No murmur heard.    No friction rub. No gallop.  Pulmonary:     Effort: No respiratory distress.     Breath sounds: No wheezing.  Abdominal:     General: There is distension.     Tenderness: There is no abdominal tenderness. There is no guarding or rebound.     Comments: Patient's periumbilical surgical incision with some surrounding erythema.  Dermabond in place.  No obvious drainage.  Musculoskeletal:        General: Normal range of motion.     Cervical  back: Normal range of motion and neck supple.  Skin:    Coloration: Skin is not pale.     Findings: No rash.  Neurological:     Mental Status: He is alert and oriented to person, place, and time.  Psychiatric:        Behavior: Behavior normal.     ED Results / Procedures / Treatments   Labs (all labs ordered are listed, but only abnormal results are displayed) Labs Reviewed  CBC WITH DIFFERENTIAL/PLATELET - Abnormal; Notable for the following components:      Result Value   RBC 3.87 (*)    Hemoglobin 12.8 (*)    HCT 37.6 (*)    All other components within normal limits  COMPREHENSIVE METABOLIC PANEL - Abnormal; Notable for the following components:   Glucose, Bld 108 (*)    Calcium 8.7 (*)    Albumin 3.1 (*)    ALT 51 (*)    All other components within normal limits  LIPASE, BLOOD - Abnormal; Notable for the following components:   Lipase 80 (*)    All other components within normal limits  LACTIC ACID, PLASMA  URINALYSIS, ROUTINE W REFLEX MICROSCOPIC  LACTIC ACID, PLASMA    EKG None  Radiology No results found.  Procedures Procedures    Medications Ordered in ED Medications  oxyCODONE-acetaminophen (PERCOCET/ROXICET) 5-325 MG per tablet 2 tablet (2 tablets Oral Given 01/10/23 1138)  HYDROmorphone (DILAUDID) injection 1 mg (1 mg Intravenous Given 01/10/23 1444)  ondansetron (ZOFRAN) injection 4 mg (4 mg Intravenous Given 01/10/23 1442)  sodium chloride 0.9 % bolus 1,000 mL (1,000 mLs Intravenous New Bag/Given 01/10/23 1441)    ED Course/ Medical Decision Making/ A&P                             Medical Decision Making Risk Prescription drug management.   72 yo M with a chief complaints of abdominal pain.  This been ongoing since he had his gallbladder removed laparoscopically about 5 days ago.  He does have some signs of superficial skin infection but with diffuse abdominal discomfort and decreased oral intake will obtain CT imaging.  Likely discuss with Gen  surgery post. Treat pain and nausea.  Reassess.  Change in renal function.  No leukocytosis, no significant electrolyte abnormality.  Patient care was signed out to Dr. Charm Barges, please see his note for further details of care in the ED.   The patients results and plan were reviewed and discussed.   Any x-rays performed were independently reviewed by myself.   Differential diagnosis were considered with the presenting HPI.  Medications  oxyCODONE-acetaminophen (PERCOCET/ROXICET) 5-325 MG per tablet 2 tablet (2 tablets Oral Given 01/10/23 1138)  HYDROmorphone (DILAUDID) injection 1 mg (1 mg Intravenous Given  01/10/23 1444)  ondansetron (ZOFRAN) injection 4 mg (4 mg Intravenous Given 01/10/23 1442)  sodium chloride 0.9 % bolus 1,000 mL (1,000 mLs Intravenous New Bag/Given 01/10/23 1441)    Vitals:   01/10/23 1009 01/10/23 1326 01/10/23 1440  BP: (!) 167/87 (!) 160/78 (!) 140/69  Pulse: 70 63 60  Resp: 18 18 18   Temp: 98.1 F (36.7 C) 98 F (36.7 C)   TempSrc: Oral    SpO2: 100% 100% 100%    Final diagnoses:  Generalized abdominal pain           Final Clinical Impression(s) / ED Diagnoses Final diagnoses:  Generalized abdominal pain    Rx / DC Orders ED Discharge Orders     None         Melene Plan, DO 01/10/23 1516

## 2023-01-22 ENCOUNTER — Other Ambulatory Visit (HOSPITAL_COMMUNITY): Payer: Self-pay

## 2023-01-22 MED ORDER — OXYCODONE-ACETAMINOPHEN 10-325 MG PO TABS
1.0000 | ORAL_TABLET | Freq: Two times a day (BID) | ORAL | 0 refills | Status: DC | PRN
  Filled 2023-01-22: qty 60, 30d supply, fill #0

## 2023-02-19 ENCOUNTER — Other Ambulatory Visit (HOSPITAL_COMMUNITY): Payer: Self-pay

## 2023-02-19 MED ORDER — OXYCODONE-ACETAMINOPHEN 10-325 MG PO TABS
1.0000 | ORAL_TABLET | Freq: Two times a day (BID) | ORAL | 0 refills | Status: DC | PRN
  Filled 2023-02-19: qty 60, 30d supply, fill #0

## 2023-02-19 MED ORDER — WEGOVY 0.25 MG/0.5ML ~~LOC~~ SOAJ
0.2500 mg | SUBCUTANEOUS | 1 refills | Status: DC
  Filled 2023-02-19: qty 2, 28d supply, fill #0

## 2023-04-02 ENCOUNTER — Other Ambulatory Visit (HOSPITAL_COMMUNITY): Payer: Self-pay

## 2023-04-02 MED ORDER — OXYCODONE-ACETAMINOPHEN 10-325 MG PO TABS
1.0000 | ORAL_TABLET | Freq: Two times a day (BID) | ORAL | 0 refills | Status: DC | PRN
  Filled 2023-04-02: qty 60, 30d supply, fill #0

## 2023-05-07 ENCOUNTER — Other Ambulatory Visit (HOSPITAL_COMMUNITY): Payer: Self-pay

## 2023-05-07 MED ORDER — OXYCODONE-ACETAMINOPHEN 10-325 MG PO TABS
1.0000 | ORAL_TABLET | Freq: Two times a day (BID) | ORAL | 0 refills | Status: DC
  Filled 2023-05-07: qty 60, 30d supply, fill #0

## 2023-06-19 ENCOUNTER — Other Ambulatory Visit (HOSPITAL_COMMUNITY): Payer: Self-pay

## 2023-06-19 MED ORDER — OXYCODONE-ACETAMINOPHEN 10-325 MG PO TABS
1.0000 | ORAL_TABLET | Freq: Two times a day (BID) | ORAL | 0 refills | Status: DC | PRN
  Filled 2023-06-19: qty 60, 30d supply, fill #0

## 2023-09-06 ENCOUNTER — Other Ambulatory Visit (HOSPITAL_COMMUNITY): Payer: Self-pay

## 2023-09-10 ENCOUNTER — Other Ambulatory Visit (HOSPITAL_COMMUNITY): Payer: Self-pay

## 2024-03-22 ENCOUNTER — Emergency Department (HOSPITAL_COMMUNITY)

## 2024-03-22 ENCOUNTER — Inpatient Hospital Stay (HOSPITAL_COMMUNITY)
Admission: EM | Admit: 2024-03-22 | Discharge: 2024-03-26 | DRG: 271 | Disposition: A | Discharge status: Home / Self Care | Attending: Vascular Surgery | Admitting: Vascular Surgery

## 2024-03-22 ENCOUNTER — Emergency Department (HOSPITAL_COMMUNITY)
Admit: 2024-03-22 | Discharge: 2024-03-22 | Disposition: A | Discharge status: Home / Self Care | Attending: Student | Admitting: Student

## 2024-03-22 ENCOUNTER — Other Ambulatory Visit: Payer: Self-pay

## 2024-03-22 DIAGNOSIS — I82409 Acute embolism and thrombosis of unspecified deep veins of unspecified lower extremity: Secondary | ICD-10-CM | POA: Diagnosis present

## 2024-03-22 DIAGNOSIS — I824Y2 Acute embolism and thrombosis of unspecified deep veins of left proximal lower extremity: Principal | ICD-10-CM

## 2024-03-22 DIAGNOSIS — Z95828 Presence of other vascular implants and grafts: Secondary | ICD-10-CM | POA: Diagnosis not present

## 2024-03-22 DIAGNOSIS — Z7901 Long term (current) use of anticoagulants: Secondary | ICD-10-CM | POA: Diagnosis not present

## 2024-03-22 DIAGNOSIS — Z9049 Acquired absence of other specified parts of digestive tract: Secondary | ICD-10-CM

## 2024-03-22 DIAGNOSIS — I871 Compression of vein: Secondary | ICD-10-CM | POA: Diagnosis present

## 2024-03-22 DIAGNOSIS — M7989 Other specified soft tissue disorders: Secondary | ICD-10-CM

## 2024-03-22 DIAGNOSIS — I82412 Acute embolism and thrombosis of left femoral vein: Secondary | ICD-10-CM | POA: Diagnosis present

## 2024-03-22 DIAGNOSIS — S8982XD Other specified injuries of left lower leg, subsequent encounter: Secondary | ICD-10-CM | POA: Diagnosis not present

## 2024-03-22 DIAGNOSIS — Z7985 Long-term (current) use of injectable non-insulin antidiabetic drugs: Secondary | ICD-10-CM

## 2024-03-22 DIAGNOSIS — I82432 Acute embolism and thrombosis of left popliteal vein: Secondary | ICD-10-CM | POA: Diagnosis present

## 2024-03-22 DIAGNOSIS — Z9889 Other specified postprocedural states: Secondary | ICD-10-CM | POA: Diagnosis not present

## 2024-03-22 DIAGNOSIS — I10 Essential (primary) hypertension: Secondary | ICD-10-CM | POA: Diagnosis present

## 2024-03-22 DIAGNOSIS — I82402 Acute embolism and thrombosis of unspecified deep veins of left lower extremity: Secondary | ICD-10-CM | POA: Diagnosis not present

## 2024-03-22 DIAGNOSIS — Z7902 Long term (current) use of antithrombotics/antiplatelets: Secondary | ICD-10-CM | POA: Diagnosis not present

## 2024-03-22 DIAGNOSIS — Z7982 Long term (current) use of aspirin: Secondary | ICD-10-CM

## 2024-03-22 DIAGNOSIS — I82422 Acute embolism and thrombosis of left iliac vein: Principal | ICD-10-CM | POA: Diagnosis present

## 2024-03-22 DIAGNOSIS — Z79899 Other long term (current) drug therapy: Secondary | ICD-10-CM

## 2024-03-22 DIAGNOSIS — I82220 Acute embolism and thrombosis of inferior vena cava: Secondary | ICD-10-CM | POA: Diagnosis not present

## 2024-03-22 DIAGNOSIS — E860 Dehydration: Secondary | ICD-10-CM | POA: Diagnosis present

## 2024-03-22 LAB — CBC WITH DIFFERENTIAL/PLATELET
Abs Immature Granulocytes: 0.03 10*3/uL (ref 0.00–0.07)
Basophils Absolute: 0 10*3/uL (ref 0.0–0.1)
Basophils Relative: 1 %
Eosinophils Absolute: 0.2 10*3/uL (ref 0.0–0.5)
Eosinophils Relative: 2 %
HCT: 34.3 % — ABNORMAL LOW (ref 39.0–52.0)
Hemoglobin: 11.2 g/dL — ABNORMAL LOW (ref 13.0–17.0)
Immature Granulocytes: 0 %
Lymphocytes Relative: 19 %
Lymphs Abs: 1.4 10*3/uL (ref 0.7–4.0)
MCH: 32.6 pg (ref 26.0–34.0)
MCHC: 32.7 g/dL (ref 30.0–36.0)
MCV: 99.7 fL (ref 80.0–100.0)
Monocytes Absolute: 0.6 10*3/uL (ref 0.1–1.0)
Monocytes Relative: 9 %
Neutro Abs: 5.1 10*3/uL (ref 1.7–7.7)
Neutrophils Relative %: 69 %
Platelets: 385 10*3/uL (ref 150–400)
RBC: 3.44 MIL/uL — ABNORMAL LOW (ref 4.22–5.81)
RDW: 12.7 % (ref 11.5–15.5)
WBC: 7.4 10*3/uL (ref 4.0–10.5)
nRBC: 0 % (ref 0.0–0.2)

## 2024-03-22 LAB — COMPREHENSIVE METABOLIC PANEL WITH GFR
ALT: 41 U/L (ref 0–44)
AST: 26 U/L (ref 15–41)
Albumin: 3.3 g/dL — ABNORMAL LOW (ref 3.5–5.0)
Alkaline Phosphatase: 93 U/L (ref 38–126)
Anion gap: 12 (ref 5–15)
BUN: 16 mg/dL (ref 8–23)
CO2: 25 mmol/L (ref 22–32)
Calcium: 9 mg/dL (ref 8.9–10.3)
Chloride: 103 mmol/L (ref 98–111)
Creatinine, Ser: 1.13 mg/dL (ref 0.61–1.24)
GFR, Estimated: >60 mL/min (ref >60)
Glucose, Bld: 137 mg/dL — ABNORMAL HIGH (ref 70–99)
Potassium: 4 mmol/L (ref 3.5–5.1)
Sodium: 140 mmol/L (ref 135–145)
Total Bilirubin: 0.9 mg/dL (ref 0.0–1.2)
Total Protein: 6.6 g/dL (ref 6.5–8.1)

## 2024-03-22 LAB — HEPARIN LEVEL (UNFRACTIONATED)
Heparin Unfractionated: >1.1 [IU]/mL — ABNORMAL HIGH (ref 0.30–0.70)
Heparin Unfractionated: >1.1 [IU]/mL — ABNORMAL HIGH (ref 0.30–0.70)

## 2024-03-22 LAB — APTT
aPTT: 34 s (ref 24–36)
aPTT: 80 s — ABNORMAL HIGH (ref 24–36)

## 2024-03-22 MED ORDER — MORPHINE SULFATE (PF) 4 MG/ML IV SOLN
6.0000 mg | Freq: Once | INTRAVENOUS | Status: AC
  Administered 2024-03-22: 6 mg via INTRAVENOUS
  Filled 2024-03-22 (×2): qty 2

## 2024-03-22 MED ORDER — GUAIFENESIN-DM 100-10 MG/5ML PO SYRP
15.0000 mL | ORAL_SOLUTION | ORAL | Status: DC | PRN
Start: 2024-03-22 — End: 2024-03-26

## 2024-03-22 MED ORDER — IOHEXOL 350 MG/ML SOLN
125.0000 mL | Freq: Once | INTRAVENOUS | Status: AC | PRN
  Administered 2024-03-22: 125 mL via INTRAVENOUS

## 2024-03-22 MED ORDER — METOPROLOL TARTRATE 5 MG/5ML IV SOLN: 2.0000 mg | INTRAVENOUS | Status: DC | PRN

## 2024-03-22 MED ORDER — LISINOPRIL-HYDROCHLOROTHIAZIDE 10-12.5 MG PO TABS: 1.0000 | ORAL_TABLET | Freq: Every day | ORAL | Status: DC

## 2024-03-22 MED ORDER — MORPHINE SULFATE (PF) 4 MG/ML IV SOLN
4.0000 mg | Freq: Once | INTRAVENOUS | Status: AC
  Administered 2024-03-22: 4 mg via INTRAVENOUS
  Filled 2024-03-22: qty 1

## 2024-03-22 MED ORDER — ALUM & MAG HYDROXIDE-SIMETH 200-200-20 MG/5ML PO SUSP: 15.0000 mL | ORAL | Status: DC | PRN

## 2024-03-22 MED ORDER — POTASSIUM CHLORIDE CRYS ER 20 MEQ PO TBCR: 20.0000 meq | EXTENDED_RELEASE_TABLET | Freq: Once | ORAL | Status: DC

## 2024-03-22 MED ORDER — HEPARIN (PORCINE) 25000 UT/250ML-% IV SOLN
1600.0000 [IU]/h | INTRAVENOUS | Status: DC
  Administered 2024-03-22 – 2024-03-23 (×3): 1600 [IU]/h via INTRAVENOUS
  Filled 2024-03-22 (×3): qty 250

## 2024-03-22 MED ORDER — HYDRALAZINE HCL 20 MG/ML IJ SOLN: 5.0000 mg | INTRAMUSCULAR | Status: DC | PRN

## 2024-03-22 MED ORDER — OXYCODONE HCL 5 MG PO TABS
5.0000 mg | ORAL_TABLET | ORAL | Status: DC | PRN
  Administered 2024-03-22 – 2024-03-23 (×3): 10 mg via ORAL
  Administered 2024-03-24 (×2): 5 mg via ORAL
  Administered 2024-03-24 – 2024-03-26 (×6): 10 mg via ORAL
  Administered 2024-03-26: 5 mg via ORAL
  Administered 2024-03-26: 10 mg via ORAL
  Filled 2024-03-22 (×12): qty 2

## 2024-03-22 MED ORDER — LISINOPRIL 10 MG PO TABS
10.0000 mg | ORAL_TABLET | Freq: Every day | ORAL | Status: DC
  Administered 2024-03-25 – 2024-03-26 (×2): 10 mg via ORAL
  Filled 2024-03-22 (×3): qty 1

## 2024-03-22 MED ORDER — ONDANSETRON HCL 4 MG/2ML IJ SOLN: 4.0000 mg | Freq: Four times a day (QID) | INTRAMUSCULAR | Status: DC | PRN

## 2024-03-22 MED ORDER — LABETALOL HCL 5 MG/ML IV SOLN: 10.0000 mg | INTRAVENOUS | Status: DC | PRN

## 2024-03-22 MED ORDER — ONDANSETRON HCL 4 MG/2ML IJ SOLN
4.0000 mg | Freq: Once | INTRAMUSCULAR | Status: AC
  Administered 2024-03-22: 4 mg via INTRAVENOUS
  Filled 2024-03-22: qty 2

## 2024-03-22 MED ORDER — HYDROCHLOROTHIAZIDE 12.5 MG PO TABS
12.5000 mg | ORAL_TABLET | Freq: Every day | ORAL | Status: DC
  Administered 2024-03-25 – 2024-03-26 (×2): 12.5 mg via ORAL
  Filled 2024-03-22 (×3): qty 1

## 2024-03-22 MED ORDER — PHENOL 1.4 % MT LIQD: 1.0000 | OROMUCOSAL | Status: DC | PRN

## 2024-03-22 MED ORDER — MORPHINE SULFATE (PF) 2 MG/ML IV SOLN
2.0000 mg | INTRAVENOUS | Status: DC | PRN
  Administered 2024-03-22 – 2024-03-23 (×11): 5 mg via INTRAVENOUS
  Administered 2024-03-24: 4 mg via INTRAVENOUS
  Administered 2024-03-24: 2 mg via INTRAVENOUS
  Administered 2024-03-24: 5 mg via INTRAVENOUS
  Administered 2024-03-24: 4 mg via INTRAVENOUS
  Administered 2024-03-24 (×2): 5 mg via INTRAVENOUS
  Administered 2024-03-24: 2 mg via INTRAVENOUS
  Administered 2024-03-24 (×2): 5 mg via INTRAVENOUS
  Filled 2024-03-22 (×3): qty 3
  Filled 2024-03-22: qty 2
  Filled 2024-03-22 (×13): qty 3
  Filled 2024-03-22 (×2): qty 2
  Filled 2024-03-22: qty 3

## 2024-03-22 MED ORDER — HYDROCODONE-ACETAMINOPHEN 5-325 MG PO TABS
2.0000 | ORAL_TABLET | ORAL | Status: DC | PRN
  Administered 2024-03-22: 2 via ORAL
  Filled 2024-03-22: qty 2

## 2024-03-22 MED ORDER — PANTOPRAZOLE SODIUM 40 MG PO TBEC
40.0000 mg | DELAYED_RELEASE_TABLET | Freq: Every day | ORAL | Status: DC
  Administered 2024-03-25 – 2024-03-26 (×2): 40 mg via ORAL
  Filled 2024-03-22 (×3): qty 1

## 2024-03-22 MED ORDER — SODIUM CHLORIDE 0.9 % IV SOLN: INTRAVENOUS | Status: AC
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
## 2024-03-22 NOTE — Progress Notes (Signed)
 PHARMACY - ANTICOAGULATION CONSULT NOTE  Pharmacy Consult for heparin  Indication: DVT  Labs: Recent Labs    03/22/24 1113 03/22/24 1454 03/22/24 2235  HGB 11.2*  --   --   HCT 34.3*  --   --   PLT 385  --   --   APTT  --  34 80*  HEPARINUNFRC  --  >1.10* >1.10*  CREATININE 1.13  --   --    Assessment/Plan:  73yo male therapeutic on heparin  with initial dosing while DOAC on hold. Will continue infusion at current rate of 1600 units/hr and confirm stable with am labs.  Marvetta Dauphin, PharmD, BCPS 03/22/2024 11:36 PM
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
## 2024-03-22 NOTE — ED Notes (Signed)
 Pt is on monitor. CCMD notified.

## 2024-03-22 NOTE — ED Notes (Signed)
 Pt ambulated to bathroom and back. Pt would not allow me to put on his tennis shoes or hospital socks. Educated patient on risks of falling.
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
## 2024-03-22 NOTE — Progress Notes (Signed)
 Left lower extremity venous duplex has been completed. Preliminary results can be found in CV Proc through chart review.  Results were given to Dr. Albertina.  03/22/24 12:20 PM Cathlyn Collet RVT

## 2024-03-22 NOTE — Progress Notes (Signed)
 ANTICOAGULATION CONSULT NOTE  Pharmacy Consult for Heparin  Indication: DVT  No Known Allergies  Patient Measurements: Height: 5' 9.5 (176.5 cm) Weight: 94.3 kg (208 lb) IBW/kg (Calculated) : 71.85 Heparin  Dosing Weight: 91.2 kg  Vital Signs: Temp: 99.4 F (37.4 C) (06/21 1122) BP: 149/62 (06/21 1406) Pulse Rate: 63 (06/21 1406)  Labs: Recent Labs    03/22/24 1113  HGB 11.2*  HCT 34.3*  PLT 385  CREATININE 1.13    Estimated Creatinine Clearance: 67.6 mL/min (by C-G formula based on SCr of 1.13 mg/dL).   Medical History: Past Medical History:  Diagnosis Date   Hypertension     Medications:  (Not in a hospital admission)  Scheduled:  Infusions:  PRN: HYDROcodone -acetaminophen   Assessment: 21 yom with a history of HTN and recent admission for LLE DVT for which patient was started on Eliquis (Heparin  to apixaban: 6/14). Patient is presenting with worsening swelling in LLE. Vascular has seen and is recommending heparin  for now and plan for possible thrombectomy 6/23. Heparin  per pharmacy consult placed for DVT.  Patient is on apixaban prior to arrival. Last dose 0700 6/21 per patient. Will require aPTT monitoring due to likely falsely high anti-Xa level secondary to DOAC use.  Hgb 11.2; plt 385  Goal of Therapy:  Heparin  level 0.3-0.7 units/ml aPTT 66-102 seconds Monitor platelets by anticoagulation protocol: Yes   Plan:  No initial heparin  bolus Start heparin  infusion at 1600 units/hr Check aPTT & anti-Xa level at 2200 Daily heparin  level and aPTT daily while on heparin  Continue to monitor via aPTT until levels are correlated Continue to monitor H&H and platelets  Dorn Buttner, PharmD, BCPS 03/22/2024 2:50 PM ED Clinical Pharmacist -  [phone]

## 2024-03-23 DIAGNOSIS — I871 Compression of vein: Secondary | ICD-10-CM

## 2024-03-23 LAB — CBC
HCT: 31.7 % — ABNORMAL LOW (ref 39.0–52.0)
Hemoglobin: 10.7 g/dL — ABNORMAL LOW (ref 13.0–17.0)
MCH: 33.6 pg (ref 26.0–34.0)
MCHC: 33.8 g/dL (ref 30.0–36.0)
MCV: 99.7 fL (ref 80.0–100.0)
Platelets: 334 10*3/uL (ref 150–400)
RBC: 3.18 MIL/uL — ABNORMAL LOW (ref 4.22–5.81)
RDW: 12.8 % (ref 11.5–15.5)
WBC: 6.7 10*3/uL (ref 4.0–10.5)
nRBC: 0 % (ref 0.0–0.2)

## 2024-03-23 LAB — APTT: aPTT: 92 s — ABNORMAL HIGH (ref 24–36)

## 2024-03-23 LAB — HEPARIN LEVEL (UNFRACTIONATED): Heparin Unfractionated: >1.1 [IU]/mL — ABNORMAL HIGH (ref 0.30–0.70)

## 2024-03-23 NOTE — Plan of Care (Signed)
   Problem: Clinical Measurements: Goal: Ability to maintain clinical measurements within normal limits will improve Outcome: Progressing

## 2024-03-23 NOTE — Progress Notes (Signed)
  Progress Note    03/23/2024 10:22 AM * No surgery found *  Subjective: His leg is somewhat improved today  Vitals:   03/23/24 0621 03/23/24 0857  BP: 125/63 120/72  Pulse: (!) 52 (!) 51  Resp:  16  Temp: 98.1 F (36.7 C) 97.8 F (36.6 C)  SpO2: 96% 96%    Physical Exam: Awake alert and oriented Nonlabored respirations Left leg with mild improvement in edema sensory and motor intact no mottling  CBC    Component Value Date/Time   WBC 6.7 03/23/2024 0613   RBC 3.18 (L) 03/23/2024 0613   HGB 10.7 (L) 03/23/2024 0613   HCT 31.7 (L) 03/23/2024 0613   PLT 334 03/23/2024 0613   MCV 99.7 03/23/2024 0613   MCH 33.6 03/23/2024 0613   MCHC 33.8 03/23/2024 0613   RDW 12.8 03/23/2024 0613   LYMPHSABS 1.4 03/22/2024 1113   MONOABS 0.6 03/22/2024 1113   EOSABS 0.2 03/22/2024 1113   BASOSABS 0.0 03/22/2024 1113    BMET    Component Value Date/Time   NA 140 03/22/2024 1113   K 4.0 03/22/2024 1113   CL 103 03/22/2024 1113   CO2 25 03/22/2024 1113   GLUCOSE 137 (H) 03/22/2024 1113   BUN 16 03/22/2024 1113   CREATININE 1.13 03/22/2024 1113   CALCIUM  9.0 03/22/2024 1113   GFRNONAA >60 03/22/2024 1113   GFRAA 56 (L) 12/31/2013 2242    INR No results found for: INR   Intake/Output Summary (Last 24 hours) at 03/23/2024 1022 Last data filed at 03/23/2024 0600 Gross per 24 hour  Intake 467.15 ml  Output --  Net 467.15 ml     Assessment/plan:  73 y.o. male is here with extensive left lower extremity DVT that did not resolve as an outpatient on Eliquis prescribed in [place] .  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  Continue heparin  drip and will plan for left lower extremity venous thrombectomy and likely stenting of left common iliac vein tomorrow in the Cath Lab.  He can eat breakfast and be n.p.o. afterwards until procedure.  I discussed the risk  benefits alternatives the patient he demonstrates good understanding.  Will attempt to get him fit into the schedule earlier tomorrow but he understands that he may end up with procedure later in the afternoon.    Angellica Maddison C. Sheree, MD [facility] Office: [phone] Pager: [phone]  03/23/2024 10:22 AM

## 2024-03-23 NOTE — Plan of Care (Signed)
   Problem: Clinical Measurements: Goal: Diagnostic test results will improve Outcome: Progressing

## 2024-03-23 NOTE — Progress Notes (Signed)
 ANTICOAGULATION CONSULT NOTE  Pharmacy Consult for Heparin  Indication: DVT  No Known Allergies  Patient Measurements: Height: 5' 9.5 (176.5 cm) Weight: 94.3 kg (208 lb) IBW/kg (Calculated) : 71.85 Heparin  Dosing Weight: 91.2 kg  Vital Signs: Temp: 98.1 F (36.7 C) (06/22 0621) Temp Source: Oral (06/22 0621) BP: 125/63 (06/22 0621) Pulse Rate: 52 (06/22 0621)  Labs: Recent Labs    03/22/24 1113 03/22/24 1454 03/22/24 2235 03/23/24 0613  HGB 11.2*  --   --  10.7*  HCT 34.3*  --   --  31.7*  PLT 385  --   --  334  APTT  --  34 80* 92*  HEPARINUNFRC  --  >1.10* >1.10* >1.10*  CREATININE 1.13  --   --   --     Estimated Creatinine Clearance: 67.6 mL/min (by C-G formula based on SCr of 1.13 mg/dL).   Medical History: Past Medical History:  Diagnosis Date   Hypertension     Medications:  Medications Prior to Admission  Medication Sig Dispense Refill Last Dose/Taking   apixaban (ELIQUIS) 5 MG TABS tablet Take 5 mg by mouth 2 (two) times daily.   03/21/2024 Bedtime   lisinopril -hydrochlorothiazide  (ZESTORETIC ) 10-12.5 MG tablet Take 1 tablet by mouth daily. 90 tablet 3 03/22/2024   oxyCODONE  10 MG TABS Take 1 tablet (10 mg total) by mouth every 4 (four) hours as needed for moderate pain or severe pain. (Patient taking differently: Take 10 mg by mouth every 4 (four) hours.) 20 tablet 0 03/22/2024   oxyCODONE -acetaminophen  (PERCOCET) 10-325 MG tablet Take 1 tablet by mouth 2 (two) times daily as needed. (Patient not taking: Reported on 03/22/2024) 60 tablet 0 Not Taking   oxyCODONE -acetaminophen  (PERCOCET) 10-325 MG tablet Take 1 tablet by mouth 2 (two) times daily as needed (Patient not taking: Reported on 03/22/2024) 60 tablet 0 Not Taking   oxyCODONE -acetaminophen  (PERCOCET) 10-325 MG tablet Take 1 tablet by mouth 2 (two) times daily as needed. (Patient not taking: Reported on 03/22/2024) 60 tablet 0 Not Taking   Semaglutide -Weight Management (WEGOVY ) 0.25 MG/0.5ML SOAJ Inject  0.25 mg into the skin once a week. (Patient not taking: Reported on 03/22/2024) 2 mL 1 Not Taking   Scheduled:   lisinopril   10 mg Oral Daily   And   hydrochlorothiazide   12.5 mg Oral Daily   pantoprazole   40 mg Oral Daily   potassium chloride  20-40 mEq Oral Once   Infusions:   sodium chloride      heparin  1,600 Units/hr (03/23/24 0734)   PRN: alum & mag hydroxide-simeth, guaiFENesin-dextromethorphan, hydrALAZINE , labetalol, metoprolol tartrate, morphine  injection, ondansetron , oxyCODONE , phenol  Assessment: 72 yom with a history of HTN and recent admission for LLE DVT for which patient was started on Eliquis (Heparin  to apixaban: 6/14). Patient is presenting with worsening swelling in LLE. Vascular has seen and is recommending heparin  for now and plan for possible thrombectomy 6/23. Heparin  per pharmacy consult placed for DVT.  Patient is on apixaban prior to arrival. Last dose 0700 6/21 per patient. Monitoring aPTT monitoring due to likely falsely high anti-Xa level secondary to DOAC use.  Overnight aPTT therapeutic at 80. This morning, aPTT 90, which is also therapeutic. Xa level remains elevated in setting of recent DOAC use. Notable possible thrombectomy with stenting of LLE on Monday.  Goal of Therapy:  Heparin  level 0.3-0.7 units/ml aPTT 66-102 seconds Monitor platelets by anticoagulation protocol: Yes   Plan:  Continue heparin  infusion at 1600 units/hr Daily heparin  level and aPTT daily while  on heparin  Continue to monitor via aPTT until levels are correlated Continue to monitor H&H and platelets  Con Laughter, PharmD PGY-2 Infectious Diseases Pharmacy Resident [facility] 03/23/2024 7:54 AM

## 2024-03-24 ENCOUNTER — Encounter (HOSPITAL_COMMUNITY)
Admission: EM | Disposition: A | Discharge status: Home / Self Care | Payer: Self-pay | Source: Home / Self Care | Attending: Vascular Surgery

## 2024-03-24 DIAGNOSIS — I82412 Acute embolism and thrombosis of left femoral vein: Secondary | ICD-10-CM

## 2024-03-24 DIAGNOSIS — I82432 Acute embolism and thrombosis of left popliteal vein: Secondary | ICD-10-CM

## 2024-03-24 DIAGNOSIS — I82220 Acute embolism and thrombosis of inferior vena cava: Secondary | ICD-10-CM

## 2024-03-24 DIAGNOSIS — I82422 Acute embolism and thrombosis of left iliac vein: Principal | ICD-10-CM

## 2024-03-24 HISTORY — PX: LOWER EXTREMITY INTERVENTION: CATH118252

## 2024-03-24 HISTORY — PX: LOWER EXTREMITY VENOGRAPHY: CATH118253

## 2024-03-24 LAB — CBC
HCT: 32.5 % — ABNORMAL LOW (ref 39.0–52.0)
Hemoglobin: 10.6 g/dL — ABNORMAL LOW (ref 13.0–17.0)
MCH: 32.2 pg (ref 26.0–34.0)
MCHC: 32.6 g/dL (ref 30.0–36.0)
MCV: 98.8 fL (ref 80.0–100.0)
Platelets: 339 10*3/uL (ref 150–400)
RBC: 3.29 MIL/uL — ABNORMAL LOW (ref 4.22–5.81)
RDW: 12.6 % (ref 11.5–15.5)
WBC: 7.4 10*3/uL (ref 4.0–10.5)
nRBC: 0 % (ref 0.0–0.2)

## 2024-03-24 LAB — HEPARIN LEVEL (UNFRACTIONATED): Heparin Unfractionated: >1.1 [IU]/mL — ABNORMAL HIGH (ref 0.30–0.70)

## 2024-03-24 LAB — APTT: aPTT: 77 s — ABNORMAL HIGH (ref 24–36)

## 2024-03-24 MED ORDER — FENTANYL CITRATE (PF) 100 MCG/2ML IJ SOLN
INTRAMUSCULAR | Status: AC
  Filled 2024-03-24: qty 2

## 2024-03-24 MED ORDER — MIDAZOLAM HCL 2 MG/2ML IJ SOLN
INTRAMUSCULAR | Status: DC | PRN
  Administered 2024-03-24 (×2): 1 mg via INTRAVENOUS

## 2024-03-24 MED ORDER — HEPARIN (PORCINE) IN NACL 1000-0.9 UT/500ML-% IV SOLN
INTRAVENOUS | Status: DC | PRN
  Administered 2024-03-24: 500 mL

## 2024-03-24 MED ORDER — OXYCODONE HCL 5 MG PO TABS
ORAL_TABLET | ORAL | Status: AC
  Filled 2024-03-24: qty 1

## 2024-03-24 MED ORDER — SODIUM CHLORIDE 0.9 % IV SOLN: 250.0000 mL | INTRAVENOUS | Status: AC | PRN

## 2024-03-24 MED ORDER — SODIUM CHLORIDE 0.9 % IV SOLN
INTRAVENOUS | Status: AC | PRN
  Administered 2024-03-24 (×2): 10 mL/h via INTRAVENOUS

## 2024-03-24 MED ORDER — ASPIRIN 81 MG PO TBEC
81.0000 mg | DELAYED_RELEASE_TABLET | Freq: Every day | ORAL | Status: DC
  Administered 2024-03-25 – 2024-03-26 (×2): 81 mg via ORAL
  Filled 2024-03-24 (×2): qty 1

## 2024-03-24 MED ORDER — LIDOCAINE HCL (PF) 1 % IJ SOLN
INTRAMUSCULAR | Status: DC | PRN
  Administered 2024-03-24: 10 mL

## 2024-03-24 MED ORDER — FENTANYL CITRATE (PF) 100 MCG/2ML IJ SOLN
INTRAMUSCULAR | Status: DC | PRN
  Administered 2024-03-24 (×2): 25 ug via INTRAVENOUS
  Administered 2024-03-24: 50 ug via INTRAVENOUS

## 2024-03-24 MED ORDER — HEPARIN SODIUM (PORCINE) 1000 UNIT/ML IJ SOLN
INTRAMUSCULAR | Status: AC
  Filled 2024-03-24: qty 10

## 2024-03-24 MED ORDER — HYDROMORPHONE HCL 1 MG/ML IJ SOLN
INTRAMUSCULAR | Status: AC
  Administered 2024-03-24: 0.5 mg
  Filled 2024-03-24: qty 0.5

## 2024-03-24 MED ORDER — HEPARIN (PORCINE) 25000 UT/250ML-% IV SOLN
1600.0000 [IU]/h | INTRAVENOUS | Status: DC
  Administered 2024-03-24 – 2024-03-25 (×2): 1600 [IU]/h via INTRAVENOUS
  Filled 2024-03-24 (×2): qty 250

## 2024-03-24 MED ORDER — IODIXANOL 320 MG/ML IV SOLN
INTRAVENOUS | Status: DC | PRN
  Administered 2024-03-24: 20 mL

## 2024-03-24 MED ORDER — MIDAZOLAM HCL 2 MG/2ML IJ SOLN
INTRAMUSCULAR | Status: AC
  Filled 2024-03-24: qty 2

## 2024-03-24 MED ORDER — HYDROMORPHONE HCL 1 MG/ML IJ SOLN
INTRAMUSCULAR | Status: AC
  Filled 2024-03-24: qty 0.5

## 2024-03-24 MED ORDER — SODIUM CHLORIDE 0.9% FLUSH: 3.0000 mL | INTRAVENOUS | Status: DC | PRN

## 2024-03-24 MED ORDER — SODIUM CHLORIDE 0.9 % IV SOLN: INTRAVENOUS | Status: AC

## 2024-03-24 MED ORDER — HYDROMORPHONE HCL 1 MG/ML IJ SOLN
1.0000 mg | Freq: Once | INTRAMUSCULAR | Status: AC
  Administered 2024-03-24: 1 mg via INTRAVENOUS

## 2024-03-24 MED ORDER — HEPARIN SODIUM (PORCINE) 1000 UNIT/ML IJ SOLN
INTRAMUSCULAR | Status: DC | PRN
Start: 2024-03-24 — End: 2024-03-24
  Administered 2024-03-24: 8000 [IU] via INTRAVENOUS

## 2024-03-24 MED ORDER — LIDOCAINE HCL (PF) 1 % IJ SOLN
INTRAMUSCULAR | Status: AC
  Filled 2024-03-24: qty 30

## 2024-03-24 MED ORDER — SODIUM CHLORIDE 0.9% FLUSH
3.0000 mL | Freq: Two times a day (BID) | INTRAVENOUS | Status: DC
  Administered 2024-03-25 – 2024-03-26 (×3): 3 mL via INTRAVENOUS

## 2024-03-24 MED ORDER — MORPHINE SULFATE (PF) 2 MG/ML IV SOLN
INTRAVENOUS | Status: AC
  Filled 2024-03-24: qty 1

## 2024-03-24 NOTE — Progress Notes (Signed)
  Progress Note    03/24/2024 7:16 AM * Day of Surgery *  Subjective: No overnight issues  Vitals:   03/23/24 1929 03/24/24 0300  BP: 121/66 134/66  Pulse: (!) 51 (!) 53  Resp: 16 18  Temp: 98.6 F (37 C) 97.8 F (36.6 C)  SpO2: 98% 97%    Physical Exam: Awake alert and oriented On the respirations Left lower extremity with stable edema  CBC    Component Value Date/Time   WBC 7.4 03/24/2024 0610   RBC 3.29 (L) 03/24/2024 0610   HGB 10.6 (L) 03/24/2024 0610   HCT 32.5 (L) 03/24/2024 0610   PLT 339 03/24/2024 0610   MCV 98.8 03/24/2024 0610   MCH 32.2 03/24/2024 0610   MCHC 32.6 03/24/2024 0610   RDW 12.6 03/24/2024 0610   LYMPHSABS 1.4 03/22/2024 1113   MONOABS 0.6 03/22/2024 1113   EOSABS 0.2 03/22/2024 1113   BASOSABS 0.0 03/22/2024 1113    BMET    Component Value Date/Time   NA 140 03/22/2024 1113   K 4.0 03/22/2024 1113   CL 103 03/22/2024 1113   CO2 25 03/22/2024 1113   GLUCOSE 137 (H) 03/22/2024 1113   BUN 16 03/22/2024 1113   CREATININE 1.13 03/22/2024 1113   CALCIUM  9.0 03/22/2024 1113   GFRNONAA >60 03/22/2024 1113   GFRAA 56 (L) 12/31/2013 2242    INR No results found for: INR   Intake/Output Summary (Last 24 hours) at 03/24/2024 0716 Last data filed at 03/24/2024 0300 Gross per 24 hour  Intake 334.68 ml  Output --  Net 334.68 ml     Assessment/plan:  73 y.o. 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  Plan for left lower extremity venous thrombectomy today with possible stenting of left common iliac vein.  Again discussed the risk and benefits he demonstrates good understanding consent was signed.   Jadasia Haws C. Sheree, MD [facility] [place] Office: [phone] Pager: [phone]  03/24/2024 7:16 AM

## 2024-03-24 NOTE — Progress Notes (Signed)
 ANTICOAGULATION CONSULT NOTE  Pharmacy Consult for Heparin  Indication: DVT  No Known Allergies  Patient Measurements: Height: 5' 9.5 (176.5 cm) Weight: 94.3 kg (208 lb) IBW/kg (Calculated) : 71.85 Heparin  Dosing Weight: 91.2 kg  Vital Signs: Temp: 98.3 F (36.8 C) (06/23 1307) Temp Source: Oral (06/23 1307) BP: 161/71 (06/23 1307) Pulse Rate: 58 (06/23 1307)  Labs: Recent Labs    03/22/24 1113 03/22/24 1454 03/22/24 2235 03/23/24 0613 03/24/24 0610  HGB 11.2*  --   --  10.7* 10.6*  HCT 34.3*  --   --  31.7* 32.5*  PLT 385  --   --  334 339  APTT  --    < > 80* 92* 77*  HEPARINUNFRC  --    < > >1.10* >1.10* >1.10*  CREATININE 1.13  --   --   --   --    < > = values in this interval not displayed.    Estimated Creatinine Clearance: 67.6 mL/min (by C-G formula based on SCr of 1.13 mg/dL).   Assessment: 43 yom with a history of HTN and recent admission for LLE DVT for which patient was started on Eliquis (Heparin  to apixaban: 6/14). Patient is presenting with worsening swelling in LLE. Vascular has seen and is recommending heparin  for now and plan for possible thrombectomy 6/23. Heparin  per pharmacy consult placed for DVT.  Patient is on apixaban prior to arrival. Last dose 0700 6/21 per patient. Monitoring aPTT monitoring due to likely falsely high anti-Xa level secondary to DOAC use.  S/p thrombectomy - heparin  to resume at 1800 pm   Goal of Therapy:  Heparin  level 0.3-0.7 units/ml aPTT 66-102 seconds Monitor platelets by anticoagulation protocol: Yes   Plan:  Heparin  infusion at 1600 units/hr at 1800 pm Daily heparin  level and aPTT daily while on heparin   Thank you. Olam Monte, PharmD 03/24/2024 3:34 PM

## 2024-03-24 NOTE — Progress Notes (Signed)
 ANTICOAGULATION CONSULT NOTE  Pharmacy Consult for Heparin  Indication: DVT  No Known Allergies  Patient Measurements: Height: 5' 9.5 (176.5 cm) Weight: 94.3 kg (208 lb) IBW/kg (Calculated) : 71.85 Heparin  Dosing Weight: 91.2 kg  Vital Signs: Temp: 97.8 F (36.6 C) (06/23 0300) Temp Source: Oral (06/23 0300) BP: 134/66 (06/23 0300) Pulse Rate: 53 (06/23 0300)  Labs: Recent Labs    03/22/24 1113 03/22/24 1454 03/22/24 2235 03/23/24 0613 03/24/24 0610  HGB 11.2*  --   --  10.7* 10.6*  HCT 34.3*  --   --  31.7* 32.5*  PLT 385  --   --  334 339  APTT  --    < > 80* 92* 77*  HEPARINUNFRC  --    < > >1.10* >1.10* >1.10*  CREATININE 1.13  --   --   --   --    < > = values in this interval not displayed.    Estimated Creatinine Clearance: 67.6 mL/min (by C-G formula based on SCr of 1.13 mg/dL).   Medical History: Past Medical History:  Diagnosis Date   Hypertension     Medications:  Medications Prior to Admission  Medication Sig Dispense Refill Last Dose/Taking   apixaban (ELIQUIS) 5 MG TABS tablet Take 5 mg by mouth 2 (two) times daily.   03/21/2024 Bedtime   lisinopril -hydrochlorothiazide  (ZESTORETIC ) 10-12.5 MG tablet Take 1 tablet by mouth daily. 90 tablet 3 03/22/2024   oxyCODONE  10 MG TABS Take 1 tablet (10 mg total) by mouth every 4 (four) hours as needed for moderate pain or severe pain. (Patient taking differently: Take 10 mg by mouth every 4 (four) hours.) 20 tablet 0 03/22/2024   oxyCODONE -acetaminophen  (PERCOCET) 10-325 MG tablet Take 1 tablet by mouth 2 (two) times daily as needed. (Patient not taking: Reported on 03/22/2024) 60 tablet 0 Not Taking   oxyCODONE -acetaminophen  (PERCOCET) 10-325 MG tablet Take 1 tablet by mouth 2 (two) times daily as needed (Patient not taking: Reported on 03/22/2024) 60 tablet 0 Not Taking   oxyCODONE -acetaminophen  (PERCOCET) 10-325 MG tablet Take 1 tablet by mouth 2 (two) times daily as needed. (Patient not taking: Reported on  03/22/2024) 60 tablet 0 Not Taking   Semaglutide -Weight Management (WEGOVY ) 0.25 MG/0.5ML SOAJ Inject 0.25 mg into the skin once a week. (Patient not taking: Reported on 03/22/2024) 2 mL 1 Not Taking   Scheduled:   lisinopril   10 mg Oral Daily   And   hydrochlorothiazide   12.5 mg Oral Daily   pantoprazole   40 mg Oral Daily   potassium chloride  20-40 mEq Oral Once   Infusions:   heparin  1,600 Units/hr (03/23/24 2335)   PRN: alum & mag hydroxide-simeth, guaiFENesin-dextromethorphan, hydrALAZINE , labetalol, metoprolol tartrate, morphine  injection, ondansetron , oxyCODONE , phenol  Assessment: 72 yom with a history of HTN and recent admission for LLE DVT for which patient was started on Eliquis (Heparin  to apixaban: 6/14). Patient is presenting with worsening swelling in LLE. Vascular has seen and is recommending heparin  for now and plan for possible thrombectomy 6/23. Heparin  per pharmacy consult placed for DVT.  Patient is on apixaban prior to arrival. Last dose 0700 6/21 per patient. Monitoring aPTT monitoring due to likely falsely high anti-Xa level secondary to DOAC use.  Overnight aPTT therapeutic at 80. This morning, aPTT 90, which is also therapeutic. Xa level remains elevated in setting of recent DOAC use. Notable possible thrombectomy with stenting of LLE on Monday.  6/23 AM update: HL >1.10 (prior apixaban usage) aPTT 77 seconds Hgb 10.6  PLT wnl No signs of bleeding / issues w/ gtt  Goal of Therapy:  Heparin  level 0.3-0.7 units/ml aPTT 66-102 seconds Monitor platelets by anticoagulation protocol: Yes   Plan:  Continue heparin  infusion at 1600 units/hr Daily heparin  level and aPTT daily while on heparin  Continue to monitor via aPTT until levels are correlated Continue to monitor H&H and platelets F/up post thrombectomy for [facility] plan.   Benedetta Heath BS, PharmD, BCPS Clinical Pharmacist 03/24/2024 7:07 AM  Contact: [phone] after 3 PM  Be curious, not judgmental...  -Davina Sprinkles

## 2024-03-24 NOTE — Plan of Care (Signed)

## 2024-03-24 NOTE — Op Note (Signed)
    Patient name: Ernest Day MRN: [id number] DOB: Mar 04, 1951 Sex: male  03/24/2024 Pre-operative Diagnosis: Left lower extremity DVT, May Thurner syndrome Post-operative diagnosis:  Same Surgeon:  Penne BROCKS. Sheree, MD Procedure Performed: 1.  Percutaneous ultrasound-guided cannulation left popliteal vein 2.  Left lower extremity venogram 3.  Catheter selection of IVC 4.  Mechanical thrombectomy with Inari Clottriever IVC, left common iliac vein, left external iliac vein, left common femoral vein, left femoral vein and popliteal vein 5.  Intravascular r ultrasound of the IVC, vein, left external iliac vein, left common femoral vein, left femoral vein and popliteal vein 6.  Stent left common iliac vein with 14 x 100 mm Medtronic Abre 7.  Moderate sedation, Versed for 49 minutes   Indications: 73 year old male recently diagnosed with extensive left lower extremity DVT who has not had improvement on anticoagulation.  He has CT venogram which demonstrates a May-Thurner configuration.  He is indicated for thrombectomy with stenting.  Findings: There is extensive thrombosis from the popliteal vein including the femoral, common femoral, external iliac veins and common iliac veins up to the level of the IVC.  There was very tight compression of the most central left common iliac vein with only approximately 2 mm diameter.  After all of the clot was removed the compression area was stented to a total diameter of 10 mm.  Completion venogram demonstrated brisk flow through all the veins without any collateralization.  Plan will be to continue anticoagulation for total of 3 months and likely transition to antiplatelet therapy after that.     Procedure:  The patient was identified in the holding area and taken to room 8.  The patient was then placed prone.  On the table and prepped and draped in the usual sterile fashion.  A time out was called.  Ultrasound was used to evaluate the small saphenous vein  but did not appear to terminate the popliteal vein.  We then anesthetized the skin and cannulated the popliteal vein with direct ultrasound visualization and also in the same department record.  We placed a Glidewire advantage followed by an 8 [place] sheath.  Patient was fully heparinized.  We then placed a wire into the IVC and then selected the IVC was catheter and confirmed intraluminal access.  The wire was then placed in the right subclavian vein under fluoroscopic guidance.  We then performed intravascular ultrasound including the popliteal vein, femoral vein, common femoral vein, external and common iliac veins and IVC.  We then placed the Inari sheath and performed mechanical thrombectomy on a total of 5 passes until the last 1 was mostly clean.  We then reperformed intravascular ultrasound and then balloon angioplasty of the common iliac vein with a 12 mm balloon and then primarily stented with a 14 x 100 mm stent.  This was postdilated to 12 mm.  Completion intravascular ultrasound from the popliteal vein to the IVC demonstrated patency and completion venography demonstrated patency of the popliteal vein, femoral vein, common femoral vein, external and common iliac veins and IVC without any flow into collateral veins and brisk flow centrally.  Satisfied with this we then removed the wire and the sheath and the cannulation site was closed with 4-0 Monocryl.  The patient tolerated the procedure without any complication.  Contrast: 20cc   Bader Stubblefield C. Sheree, MD [facility] [place] Office: [phone] Pager: [phone]

## 2024-03-25 ENCOUNTER — Encounter (HOSPITAL_COMMUNITY): Payer: Self-pay | Admitting: Vascular Surgery

## 2024-03-25 ENCOUNTER — Other Ambulatory Visit (HOSPITAL_COMMUNITY): Payer: Self-pay

## 2024-03-25 DIAGNOSIS — Z95828 Presence of other vascular implants and grafts: Secondary | ICD-10-CM

## 2024-03-25 DIAGNOSIS — Z9889 Other specified postprocedural states: Secondary | ICD-10-CM

## 2024-03-25 DIAGNOSIS — Z7901 Long term (current) use of anticoagulants: Secondary | ICD-10-CM

## 2024-03-25 DIAGNOSIS — I82402 Acute embolism and thrombosis of unspecified deep veins of left lower extremity: Secondary | ICD-10-CM

## 2024-03-25 DIAGNOSIS — I871 Compression of vein: Secondary | ICD-10-CM

## 2024-03-25 DIAGNOSIS — Z7982 Long term (current) use of aspirin: Secondary | ICD-10-CM

## 2024-03-25 LAB — BASIC METABOLIC PANEL WITH GFR
Anion gap: 4 — ABNORMAL LOW (ref 5–15)
BUN: 12 mg/dL (ref 8–23)
CO2: 28 mmol/L (ref 22–32)
Calcium: 8.2 mg/dL — ABNORMAL LOW (ref 8.9–10.3)
Chloride: 105 mmol/L (ref 98–111)
Creatinine, Ser: 0.99 mg/dL (ref 0.61–1.24)
GFR, Estimated: >60 mL/min (ref >60)
Glucose, Bld: 119 mg/dL — ABNORMAL HIGH (ref 70–99)
Potassium: 3.7 mmol/L (ref 3.5–5.1)
Sodium: 137 mmol/L (ref 135–145)

## 2024-03-25 LAB — HEPARIN LEVEL (UNFRACTIONATED): Heparin Unfractionated: 0.75 [IU]/mL — ABNORMAL HIGH (ref 0.30–0.70)

## 2024-03-25 LAB — APTT: aPTT: 75 s — ABNORMAL HIGH (ref 24–36)

## 2024-03-25 LAB — CBC
HCT: 31.3 % — ABNORMAL LOW (ref 39.0–52.0)
Hemoglobin: 10.5 g/dL — ABNORMAL LOW (ref 13.0–17.0)
MCH: 32.6 pg (ref 26.0–34.0)
MCHC: 33.5 g/dL (ref 30.0–36.0)
MCV: 97.2 fL (ref 80.0–100.0)
Platelets: 322 10*3/uL (ref 150–400)
RBC: 3.22 MIL/uL — ABNORMAL LOW (ref 4.22–5.81)
RDW: 12.4 % (ref 11.5–15.5)
WBC: 7.3 10*3/uL (ref 4.0–10.5)
nRBC: 0 % (ref 0.0–0.2)

## 2024-03-25 MED ORDER — LISINOPRIL-HYDROCHLOROTHIAZIDE 10-12.5 MG PO TABS
1.0000 | ORAL_TABLET | Freq: Every day | ORAL | 3 refills | Status: DC
  Filled 2024-03-25: qty 90, 90d supply, fill #0

## 2024-03-25 MED ORDER — MORPHINE SULFATE (PF) 4 MG/ML IV SOLN: 4.0000 mg | INTRAVENOUS | Status: AC | PRN

## 2024-03-25 MED ORDER — APIXABAN 5 MG PO TABS
5.0000 mg | ORAL_TABLET | Freq: Two times a day (BID) | ORAL | Status: DC
  Administered 2024-03-25 – 2024-03-26 (×3): 5 mg via ORAL
  Filled 2024-03-25 (×3): qty 1

## 2024-03-25 NOTE — Plan of Care (Signed)

## 2024-03-25 NOTE — Progress Notes (Addendum)
 ANTICOAGULATION CONSULT NOTE  Pharmacy Consult for Heparin > Transition back to Eliquis Indication: DVT   No Known Allergies  Patient Measurements: Height: 5' 9.5 (176.5 cm) Weight: 94.3 kg (208 lb) IBW/kg (Calculated) : 71.85 Heparin  Dosing Weight: 91.2 kg  Vital Signs: Temp: 98.1 F (36.7 C) (06/23 2334) Temp Source: Oral (06/23 2334) BP: 132/55 (06/23 2334)  Labs: Recent Labs    03/22/24 1113 03/22/24 1454 03/23/24 0613 03/24/24 0610 03/25/24 0309  HGB 11.2*  --  10.7* 10.6* 10.5*  HCT 34.3*  --  31.7* 32.5* 31.3*  PLT 385  --  334 339 322  APTT  --    < > 92* 77* 75*  HEPARINUNFRC  --    < > >1.10* >1.10* 0.75*  CREATININE 1.13  --   --   --  0.99   < > = values in this interval not displayed.    Estimated Creatinine Clearance: 77.2 mL/min (by C-G formula based on SCr of 0.99 mg/dL).   Assessment: 57 yom with a history of HTN and recent admission for LLE DVT for which patient was started on Eliquis (Heparin  to apixaban: 6/14). Patient is presenting with worsening swelling in LLE. Vascular has seen and is recommending heparin  for now and plan for possible thrombectomy 6/23. Heparin  per pharmacy consult placed for DVT.  Patient is on apixaban prior to arrival. Last dose 0700 6/21 per patient. Monitoring aPTT monitoring due to likely falsely high anti-Xa level secondary to DOAC use.  S/p LLE thrombectomy - heparin  to resume at 1800 pm 03/24/24  03/25/24:  aPTT = 75, within goal 66-102 sec.  HL still effected by recent PTA apixaban.  Therapeutic aPTT, on Heparin  rate 1600 units/hr.  CBC: Hgb stable at 10.5 and PLTC wnl.  No bleeding reported.     Goal of Therapy:  Heparin  level 0.3-0.7 units/ml aPTT 66-102 seconds Monitor platelets by anticoagulation protocol: Yes   Plan: Continue Heparin  infusion at 1600 units/hr  Daily  aPTT, HL and CBC while on heparin  Using aPTT monitoring due to likely falsely high anti-Xa level secondary to DOAC use.   Thank  you. Levorn Gaskins, RPh Clinical Pharmacist 03/25/2024 7:52 AM  ADDENDUM:  Transition back to Eliquiis  Plan:  Discontinue IV heparin  and resume Eliquis 5mg  BID now.   Levorn Gaskins, RPh Clinical Pharmacist 03/25/2024 8:15 AM

## 2024-03-25 NOTE — Progress Notes (Addendum)
  Progress Note    03/25/2024 7:40 AM 1 Day Post-Op  Subjective:  pain L leg   Vitals:   03/24/24 1941 03/24/24 2334  BP: 129/61 (!) 132/55  Pulse: 70   Resp: 12 14  Temp: 98.1 F (36.7 C) 98.1 F (36.7 C)  SpO2: 99% 99%   Physical Exam Lungs:  non labored Incisions:  L pop access site without palpable hematoma Extremities:  palpable DP pulse Neurologic: A&O  CBC    Component Value Date/Time   WBC 7.3 03/25/2024 0309   RBC 3.22 (L) 03/25/2024 0309   HGB 10.5 (L) 03/25/2024 0309   HCT 31.3 (L) 03/25/2024 0309   PLT 322 03/25/2024 0309   MCV 97.2 03/25/2024 0309   MCH 32.6 03/25/2024 0309   MCHC 33.5 03/25/2024 0309   RDW 12.4 03/25/2024 0309   LYMPHSABS 1.4 03/22/2024 1113   MONOABS 0.6 03/22/2024 1113   EOSABS 0.2 03/22/2024 1113   BASOSABS 0.0 03/22/2024 1113    BMET    Component Value Date/Time   NA 137 03/25/2024 0309   K 3.7 03/25/2024 0309   CL 105 03/25/2024 0309   CO2 28 03/25/2024 0309   GLUCOSE 119 (H) 03/25/2024 0309   BUN 12 03/25/2024 0309   CREATININE 0.99 03/25/2024 0309   CALCIUM  8.2 (L) 03/25/2024 0309   GFRNONAA >60 03/25/2024 0309   GFRAA 56 (L) 12/31/2013 2242    INR No results found for: INR   Intake/Output Summary (Last 24 hours) at 03/25/2024 0740 Last data filed at 03/25/2024 0600 Gross per 24 hour  Intake 530.78 ml  Output --  Net 530.78 ml     Assessment/Plan:  73 y.o. male is s/p mechanical thrombectomy IVC, L iliac and iliac stent placement for May Thurner 1 Day Post-Op   LLE well perfused with palpable DP pulse Continue wrap until fitted for compression sock Ambulate today Transition from heparin  back to Eliquis; continue aspirin  Wean IV pain medication today; discharge home tomorrow   Donnice Sender, PA-C [facility] [phone] 03/25/2024 7:40 AM  I have independently interviewed and examined patient and agree with PA assessment and plan above.  I remove the dressing at bedside and  the edema in his left lower extremity is significantly improved.  He will need fitted for compression stocking and transition back to Eliquis.  Plan for Eliquis for a total of 3 months and then can transition to antiplatelet therapy alone given that DVT was secondary to May Thurner syndrome with low-grade trauma that cause increased bedrest and dehydration.  He is okay to discharge when pain is controlled and plan will be to follow-up in 4 to 6 weeks from that time with IVC iliac duplex.  Jenyfer Trawick C. Sheree, MD [facility] [place] Office: [phone] Pager: [phone]

## 2024-03-25 NOTE — Progress Notes (Signed)
 Leg measurement  Ankle = 9 Calf = 16 Thigh = 22

## 2024-03-25 NOTE — Discharge Instructions (Signed)
 Information on my medicine - ELIQUIS (apixaban)  This medication education was reviewed with me or my healthcare representative as part of my discharge preparation. You were taking this medication prior to this hospital admission.   Gretta Levorn Requena, [facility]  Why was Eliquis prescribed for you? Eliquis was prescribed to treat blood clots that may have been found in the veins of your legs (deep vein thrombosis) or in your lungs (pulmonary embolism) and to reduce the risk of them occurring again.  What do You need to know about Eliquis ? The  dose is ONE 5 mg tablet taken TWICE daily.  Eliquis may be taken with or without food.   Try to take the dose about the same time in the morning and in the evening. If you have difficulty swallowing the tablet whole please discuss with your pharmacist how to take the medication safely.  Take Eliquis exactly as prescribed and DO NOT stop taking Eliquis without talking to the doctor who prescribed the medication.  Stopping may increase your risk of developing a new blood clot.  Refill your prescription before you run out.  After discharge, you should have regular check-up appointments with your healthcare provider that is prescribing your Eliquis.    What do you do if you miss a dose? If a dose of ELIQUIS is not taken at the scheduled time, take it as soon as possible on the same day and twice-daily administration should be resumed. The dose should not be doubled to make up for a missed dose.  Important Safety Information A possible side effect of Eliquis is bleeding. You should call your healthcare provider right away if you experience any of the following: Bleeding from an injury or your nose that does not stop. Unusual colored urine (red or dark brown) or unusual colored stools (red or black). Unusual bruising for unknown reasons. A serious fall or if you hit your head (even if there is no bleeding).  Some medicines may interact with Eliquis  and might increase your risk of bleeding or clotting while on Eliquis. To help avoid this, consult your healthcare provider or pharmacist prior to using any new prescription or non-prescription medications, including herbals, vitamins, non-steroidal anti-inflammatory drugs (NSAIDs) and supplements.  This website has more information on Eliquis (apixaban): http://www.eliquis.com/eliquis/home

## 2024-03-26 ENCOUNTER — Encounter (HOSPITAL_COMMUNITY): Payer: Self-pay | Admitting: Vascular Surgery

## 2024-03-26 ENCOUNTER — Other Ambulatory Visit (HOSPITAL_COMMUNITY): Payer: Self-pay

## 2024-03-26 DIAGNOSIS — Z95820 Peripheral vascular angioplasty status with implants and grafts: Secondary | ICD-10-CM

## 2024-03-26 LAB — CBC
HCT: 27.8 % — ABNORMAL LOW (ref 39.0–52.0)
Hemoglobin: 9.4 g/dL — ABNORMAL LOW (ref 13.0–17.0)
MCH: 32.5 pg (ref 26.0–34.0)
MCHC: 33.8 g/dL (ref 30.0–36.0)
MCV: 96.2 fL (ref 80.0–100.0)
Platelets: 304 10*3/uL (ref 150–400)
RBC: 2.89 MIL/uL — ABNORMAL LOW (ref 4.22–5.81)
RDW: 12.5 % (ref 11.5–15.5)
WBC: 6.9 10*3/uL (ref 4.0–10.5)
nRBC: 0 % (ref 0.0–0.2)

## 2024-03-26 MED ORDER — ASPIRIN 81 MG PO TBEC
81.0000 mg | DELAYED_RELEASE_TABLET | Freq: Every day | ORAL | 12 refills | Status: AC
  Filled 2024-03-26: qty 30, 30d supply, fill #0

## 2024-03-26 MED ORDER — OXYCODONE HCL 10 MG PO TABS
10.0000 mg | ORAL_TABLET | Freq: Four times a day (QID) | ORAL | 0 refills | Status: DC | PRN
  Filled 2024-03-26: qty 20, 5d supply, fill #0

## 2024-03-26 NOTE — Progress Notes (Addendum)
  Progress Note    03/26/2024 7:42 AM 2 Days Post-Op  Subjective:  no complaints   Vitals:   03/25/24 1949 03/25/24 2304  BP: (!) 105/50 (!) 118/58  Pulse: 64 60  Resp: 20 13  Temp: 98.4 F (36.9 C) 98 F (36.7 C)  SpO2: 99% 98%   Physical Exam: Lungs:  non labored Incisions:  L pop without hematoma Extremities:  palpable DP pulses; no significant L leg edema Neurologic: A&O  CBC    Component Value Date/Time   WBC 6.9 03/26/2024 0334   RBC 2.89 (L) 03/26/2024 0334   HGB 9.4 (L) 03/26/2024 0334   HCT 27.8 (L) 03/26/2024 0334   PLT 304 03/26/2024 0334   MCV 96.2 03/26/2024 0334   MCH 32.5 03/26/2024 0334   MCHC 33.8 03/26/2024 0334   RDW 12.5 03/26/2024 0334   LYMPHSABS 1.4 03/22/2024 1113   MONOABS 0.6 03/22/2024 1113   EOSABS 0.2 03/22/2024 1113   BASOSABS 0.0 03/22/2024 1113    BMET    Component Value Date/Time   NA 137 03/25/2024 0309   K 3.7 03/25/2024 0309   CL 105 03/25/2024 0309   CO2 28 03/25/2024 0309   GLUCOSE 119 (H) 03/25/2024 0309   BUN 12 03/25/2024 0309   CREATININE 0.99 03/25/2024 0309   CALCIUM  8.2 (L) 03/25/2024 0309   GFRNONAA >60 03/25/2024 0309   GFRAA 56 (L) 12/31/2013 2242    INR No results found for: INR  No intake or output data in the 24 hours ending 03/26/24 9257   Assessment/Plan:  73 y.o. male is s/p mechanical thrombectomy and L iliac vein stent for May Thurner syndrome 2 Days Post-Op   BLE well perfused L pop access site without hematoma Continue L LE compression sock daily Continue Eliquis for 3 months; aspirin  indefinitely IVC/iliac venous duplex in 4-6 weeks; ok for discharge home   Donnice Sender, PA-C [facility] [phone] 03/26/2024 7:42 AM  I have independently interviewed and examined patient and agree with PA assessment and plan above.  Left lower extremity has mostly returned to normal now with thigh-high compression stocking.  We discussed the timing to wear compression  stockings.  Will plan for 3 months of Eliquis then transition to aspirin  alone given the DVT was secondary to May Thurner syndrome with low-grade trauma complicated by increased bedrest and dehydration.  His pain is much better controlled today and he will follow-up in 4 to 6 weeks with IVC iliac duplex.  Alyric Parkin C. Sheree, MD [facility] [place] Office: [phone] Pager: [phone]

## 2024-03-26 NOTE — Progress Notes (Signed)
 Discharge education provided to the patient, PIV removed, CCMD notified, pt took his belongings with him, will pick up medication on the way, no any concern.

## 2024-03-26 NOTE — Discharge Summary (Signed)
  Discharge Summary  Patient ID: Ernest Day [id number] 72 y.o. April 27, 1951  Admit date: 03/22/2024  Discharge date and time: 03/26/2024  9:52 AM   Admitting Physician: Penne Lonni Colorado, MD   Discharge Physician: same  Admission Diagnoses: DVT (deep venous thrombosis) (HCC) [I82.409] Deep vein thrombosis (DVT) of proximal vein of left lower extremity, unspecified chronicity (HCC) [I82.4Y2]  Discharge Diagnoses: same  Admission Condition: poor  Discharged Condition: fair  Indication for Admission: post op care  Hospital Course: [name] is a 73 year old male who presented to the emergency department with an extensive left lower extremity DVT.  He underwent Mechanical thrombectomy with Inari IVC, left common iliac vein, left external iliac vein, left common femoral vein, left femoral vein, patent left popliteal vein with stenting of the left common iliac vein by Dr. Colorado on 03/24/2024.  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  He tolerated procedure well and was kept hospitalized postoperatively.  POD #1 pain had difficulty with postoperative pain.  He was weaned from IV narcotic pain medication.  He was transition from heparin  back to Eliquis.  He was admitted for a thigh-high 20 to 30 mmHg compression sock which was applied.  POD #2 he is ready for discharge home.  He will also be started on 81 mg aspirin  daily due to presence of iliac venous stents.  He will require 3 months of Eliquis at which time he can take aspirin  alone.  He was prescribed #20 10 mg oxycodones and made aware this will be his last prescription for pain medication.  He will follow-up in the office in 4 to 6 weeks to have an IVC/iliac venous duplex.  He was discharged home in stable condition.  Consults: None  Treatments: surgery: Mechanical thrombectomy with Inari IVC, left common iliac vein, left external iliac vein, left common femoral vein, left femoral  vein, patent left popliteal vein with stenting of the left common iliac vein by Dr. Colorado on 03/24/2024  Discharge Exam: See progress note 03/26/24 Vitals:   03/25/24 2304 03/26/24 0916  BP: (!) 118/58 (!) 157/71  Pulse: 60 71  Resp: 13 14  Temp: 98 F (36.7 C) 98 F (36.7 C)  SpO2: 98% 98%     Disposition: Discharge disposition: 01-Home or Self Care       Patient Instructions:  Allergies as of 03/26/2024   No Known Allergies      Medication List     STOP taking these medications    oxyCODONE -acetaminophen  10-325 MG tablet Commonly known as: PERCOCET       TAKE these medications    apixaban 5 MG Tabs tablet Commonly known as: ELIQUIS Take 5 mg by mouth 2 (two) times daily.   aspirin  EC 81 MG tablet Take 1 tablet (81 mg total) by mouth daily. Swallow whole.   lisinopril -hydrochlorothiazide  10-12.5 MG tablet Commonly known as: ZESTORETIC  Take 1 tablet by mouth daily.   Oxycodone  HCl 10 MG Tabs Take 1 tablet (10 mg total) by mouth every 6 (six) hours as needed. What changed:  when to take this reasons to take this   Wegovy  0.25 MG/0.5ML Soaj Generic drug: Semaglutide -Weight Management Inject 0.25 mg into the skin once a week.       Activity: activity as tolerated Diet: regular diet Wound Care: none needed  Follow-up with [facility] in 6 weeks.  Signed: Donnice Sender, PA-C 03/26/2024 10:16 AM [facility] Office: [phone]

## 2024-03-30 ENCOUNTER — Emergency Department (HOSPITAL_COMMUNITY)

## 2024-03-30 ENCOUNTER — Encounter (HOSPITAL_COMMUNITY): Payer: Self-pay | Admitting: Emergency Medicine

## 2024-03-30 ENCOUNTER — Other Ambulatory Visit: Payer: Self-pay

## 2024-03-30 ENCOUNTER — Emergency Department (HOSPITAL_COMMUNITY)
Admission: EM | Admit: 2024-03-30 | Discharge: 2024-03-30 | Disposition: A | Discharge status: Home / Self Care | Attending: Emergency Medicine | Admitting: Emergency Medicine

## 2024-03-30 DIAGNOSIS — M79605 Pain in left leg: Secondary | ICD-10-CM | POA: Insufficient documentation

## 2024-03-30 DIAGNOSIS — Z7901 Long term (current) use of anticoagulants: Secondary | ICD-10-CM | POA: Diagnosis not present

## 2024-03-30 DIAGNOSIS — Z7982 Long term (current) use of aspirin: Secondary | ICD-10-CM | POA: Insufficient documentation

## 2024-03-30 DIAGNOSIS — D72829 Elevated white blood cell count, unspecified: Secondary | ICD-10-CM | POA: Diagnosis not present

## 2024-03-30 DIAGNOSIS — M7989 Other specified soft tissue disorders: Secondary | ICD-10-CM

## 2024-03-30 LAB — COMPREHENSIVE METABOLIC PANEL WITH GFR
ALT: 21 U/L (ref 0–44)
AST: 19 U/L (ref 15–41)
Albumin: 3.7 g/dL (ref 3.5–5.0)
Alkaline Phosphatase: 91 U/L (ref 38–126)
Anion gap: 15 (ref 5–15)
BUN: 24 mg/dL — ABNORMAL HIGH (ref 8–23)
CO2: 20 mmol/L — ABNORMAL LOW (ref 22–32)
Calcium: 9.5 mg/dL (ref 8.9–10.3)
Chloride: 102 mmol/L (ref 98–111)
Creatinine, Ser: 1.02 mg/dL (ref 0.61–1.24)
GFR, Estimated: >60 mL/min (ref >60)
Glucose, Bld: 129 mg/dL — ABNORMAL HIGH (ref 70–99)
Potassium: 4 mmol/L (ref 3.5–5.1)
Sodium: 137 mmol/L (ref 135–145)
Total Bilirubin: 0.7 mg/dL (ref 0.0–1.2)
Total Protein: 7.4 g/dL (ref 6.5–8.1)

## 2024-03-30 LAB — CBC WITH DIFFERENTIAL/PLATELET
Abs Immature Granulocytes: 0.06 10*3/uL (ref 0.00–0.07)
Basophils Absolute: 0.1 10*3/uL (ref 0.0–0.1)
Basophils Relative: 1 %
Eosinophils Absolute: 0.1 10*3/uL (ref 0.0–0.5)
Eosinophils Relative: 1 %
HCT: 37.3 % — ABNORMAL LOW (ref 39.0–52.0)
Hemoglobin: 12.5 g/dL — ABNORMAL LOW (ref 13.0–17.0)
Immature Granulocytes: 1 %
Lymphocytes Relative: 18 %
Lymphs Abs: 2 10*3/uL (ref 0.7–4.0)
MCH: 32.6 pg (ref 26.0–34.0)
MCHC: 33.5 g/dL (ref 30.0–36.0)
MCV: 97.1 fL (ref 80.0–100.0)
Monocytes Absolute: 1.1 10*3/uL — ABNORMAL HIGH (ref 0.1–1.0)
Monocytes Relative: 10 %
Neutro Abs: 7.9 10*3/uL — ABNORMAL HIGH (ref 1.7–7.7)
Neutrophils Relative %: 69 %
Platelets: 521 10*3/uL — ABNORMAL HIGH (ref 150–400)
RBC: 3.84 MIL/uL — ABNORMAL LOW (ref 4.22–5.81)
RDW: 12.7 % (ref 11.5–15.5)
WBC: 11.2 10*3/uL — ABNORMAL HIGH (ref 4.0–10.5)
nRBC: 0 % (ref 0.0–0.2)

## 2024-03-30 LAB — PROTIME-INR
INR: 1.3 — ABNORMAL HIGH (ref 0.8–1.2)
Prothrombin Time: 16.8 s — ABNORMAL HIGH (ref 11.4–15.2)

## 2024-03-30 MED ORDER — MORPHINE SULFATE (PF) 4 MG/ML IV SOLN
4.0000 mg | Freq: Once | INTRAVENOUS | Status: AC
  Administered 2024-03-30: 4 mg via INTRAMUSCULAR
  Filled 2024-03-30: qty 1

## 2024-03-30 MED ORDER — OXYCODONE HCL 10 MG PO TABS: 10.0000 mg | ORAL_TABLET | Freq: Four times a day (QID) | ORAL | 0 refills | Status: AC | PRN

## 2024-03-30 MED ORDER — OXYCODONE-ACETAMINOPHEN 5-325 MG PO TABS
1.0000 | ORAL_TABLET | Freq: Once | ORAL | Status: AC
  Administered 2024-03-30: 1 via ORAL
  Filled 2024-03-30: qty 1

## 2024-03-30 NOTE — ED Notes (Signed)
 Patient transported to vascular.

## 2024-03-30 NOTE — ED Notes (Addendum)
 ED PA at bedside

## 2024-03-30 NOTE — ED Triage Notes (Signed)
 Patient arrives by POV states last week had multiple clots removed from left leg. Patient states having continued pain to left leg causing difficulty walking. Patient also reports right toe and foot numbness ongoing for a while. States he was never checked for blood clots in right leg. Has been taking pain medicine as prescribed but is not taking pain away.

## 2024-03-30 NOTE — ED Provider Notes (Signed)
 [place] EMERGENCY DEPARTMENT AT [facility] Provider Note   CSN: [id number] Arrival date & time: 03/30/24  1137     Patient presents with: Leg Pain   Ernest Day is a 73 y.o. male with a history of DVT presents the ED today for leg pain.  Patient reports that he had a left lower extremity venogram with stent placement on 6/23 with Dr. Sheree.  Patient reports worsening pain of the left lower extremity for the past 2 days as well as an area of induration at the left upper thigh.  States that he has been taking his oxycodone  as prescribed without significant improvement of pain.  States that he is been taking his Eliquis  as prescribed.  Denies any associated chest pain, shortness of breath, or dizziness.  Additionally, he is complaining about numbness in the right lower extremity.  States that he did not have much pain in the left lower extremity prior to discovery of DVT so he is concerned he could possibly have a DVT in that leg as well and is requesting ultrasound of both of his legs at this time for further evaluation.    Prior to Admission medications   Medication Sig Start Date End Date Taking? Authorizing Provider  oxyCODONE  10 MG TABS Take 1 tablet (10 mg total) by mouth every 6 (six) hours as needed for up to 5 days for severe pain (pain score 7-10). 03/30/24 04/04/24 Yes Waddell Sluder, PA-C  apixaban  (ELIQUIS ) 5 MG TABS tablet Take 5 mg by mouth 2 (two) times daily. 03/19/24 06/23/24  [provider]  aspirin  EC 81 MG tablet Take 1 tablet (81 mg total) by mouth daily. Swallow whole. 03/26/24   Bethanie Cough, PA-C  lisinopril -hydrochlorothiazide  (ZESTORETIC ) 10-12.5 MG tablet Take 1 tablet by mouth daily. 12/22/22     Semaglutide -Weight Management (WEGOVY ) 0.25 MG/0.5ML SOAJ Inject 0.25 mg into the skin once a week. Patient not taking: Reported on 03/22/2024 02/19/23       Allergies: Patient has no known allergies.    Review of Systems  Cardiovascular:  Positive for  leg swelling.  All other systems reviewed and are negative.   Updated Vital Signs BP 125/77 (BP Location: Right Arm)   Pulse 65   Temp 98.5 F (36.9 C) (Oral)   Resp 16   Ht 5' 9.5 (1.765 m)   Wt 94.3 kg   SpO2 100%   BMI 30.28 kg/m   Physical Exam Vitals and nursing note reviewed.  Constitutional:      General: He is not in acute distress.    Appearance: Normal appearance.  HENT:     Head: Normocephalic and atraumatic.     Mouth/Throat:     Mouth: Mucous membranes are moist.   Eyes:     Conjunctiva/sclera: Conjunctivae normal.     Pupils: Pupils are equal, round, and reactive to light.    Cardiovascular:     Rate and Rhythm: Normal rate and regular rhythm.     Pulses: Normal pulses.     Heart sounds: Normal heart sounds.  Pulmonary:     Effort: Pulmonary effort is normal.     Breath sounds: Normal breath sounds.  Abdominal:     Palpations: Abdomen is soft.     Tenderness: There is no abdominal tenderness.   Musculoskeletal:        General: Tenderness present. Normal range of motion.     Cervical back: Normal range of motion.     Left lower leg: Edema present.  Comments: Tenderness to palpation of left medial calf with yellow discoloration, either from Betadine from prior surgery or bruising.  Palpable knot at proximal left thigh at site of incision.  No redness or warmth to touch.  No drainage from incision site.   No swelling or tenderness to palpation of right lower extremity.  Bilateral posterior tibialis pulse intact.   Skin:    General: Skin is warm and dry.     Findings: No rash.   Neurological:     General: No focal deficit present.     Mental Status: He is alert.   Psychiatric:        Mood and Affect: Mood normal.        Behavior: Behavior normal.    (all labs ordered are listed, but only abnormal results are displayed) Labs Reviewed  CBC WITH DIFFERENTIAL/PLATELET - Abnormal; Notable for the following components:      Result Value   WBC  11.2 (*)    RBC 3.84 (*)    Hemoglobin 12.5 (*)    HCT 37.3 (*)    Platelets 521 (*)    Neutro Abs 7.9 (*)    Monocytes Absolute 1.1 (*)    All other components within normal limits  COMPREHENSIVE METABOLIC PANEL WITH GFR - Abnormal; Notable for the following components:   CO2 20 (*)    Glucose, Bld 129 (*)    BUN 24 (*)    All other components within normal limits  PROTIME-INR - Abnormal; Notable for the following components:   Prothrombin Time 16.8 (*)    INR 1.3 (*)    All other components within normal limits    EKG: None  Radiology: VAS US  LOWER EXTREMITY VENOUS (DVT) (ONLY [facility] & [facility]) Result Date: 03/30/2024  Lower Venous DVT Study Patient Name:  Ernest Day  Date of Exam:   03/30/2024 Medical Rec #: [id number]       Accession #:    [id number] Date of Birth: 1951/04/27      Patient Gender: M Patient Age:   25 years Exam Location:  [facility] Procedure:      VAS US  LOWER EXTREMITY VENOUS (DVT) Referring Phys: SOTO, JOHANA --------------------------------------------------------------------------------  Indications: Left calf pain status post mechanical thrombectomy of extensive left DVT 03/24/24. Palpable knot proximal thigh at site of bruise. Patient can only bear minimal weight on left leg, states it feels hot to touch. Pain medicine does not allay pain. Surgery: Mechanical thrombectomy with Inari IVC, left common iliac vein, left          external iliac vein, left common femoral vein, left femoral vein,          patent left popliteal vein with stenting of the left common iliac vein          by Dr. Sheree on 03/24/2024. Stenting was performed due to her May-Thurner          compression of the left iliac vein. 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 Extensive left DVT found 03/22/22 including left external iliac, common femoral, femoral, profunda, popliteal, posterior tibial, and peroneal veins. Anticoagulation: Aspirin  and Eliquis . Limitations: Poor ultrasound/tissue interface and musculoskeletal  features. Comparison Study: Prior LEV done 03/22/24 Performing Technologist: Alberta Lis RVS  Examination Guidelines: A complete evaluation includes B-mode imaging, spectral Doppler, color Doppler, and power Doppler as needed of all accessible portions of each vessel. Bilateral testing is considered an integral part of a complete examination. Limited examinations for reoccurring indications may be performed as noted. The reflux  portion of the exam is performed with the patient in reverse Trendelenburg.  +---------+---------------+---------+-----------+----------+--------------+ RIGHT    CompressibilityPhasicitySpontaneityPropertiesThrombus Aging +---------+---------------+---------+-----------+----------+--------------+ CFV      Full           Yes      Yes                                 +---------+---------------+---------+-----------+----------+--------------+ SFJ      Full                                                        +---------+---------------+---------+-----------+----------+--------------+ FV Prox  Full                                                        +---------+---------------+---------+-----------+----------+--------------+ FV Mid   Full                                                        +---------+---------------+---------+-----------+----------+--------------+ FV DistalFull                                                        +---------+---------------+---------+-----------+----------+--------------+ PFV      Full                                                        +---------+---------------+---------+-----------+----------+--------------+ POP      Full           Yes      Yes                                 +---------+---------------+---------+-----------+----------+--------------+ PTV      Full                                                         +---------+---------------+---------+-----------+----------+--------------+ PERO     Full                                                        +---------+---------------+---------+-----------+----------+--------------+ Gastroc  Full                                                        +---------+---------------+---------+-----------+----------+--------------+   +---------+---------------+---------+-----------+----------+-------------------+  LEFT     CompressibilityPhasicitySpontaneityPropertiesThrombus Aging      +---------+---------------+---------+-----------+----------+-------------------+ CFV      Full           Yes      Yes                                      +---------+---------------+---------+-----------+----------+-------------------+ SFJ      Full                                                             +---------+---------------+---------+-----------+----------+-------------------+ FV Prox  Full                                                             +---------+---------------+---------+-----------+----------+-------------------+ FV Mid   Full           Yes      Yes                                      +---------+---------------+---------+-----------+----------+-------------------+ FV DistalFull           Yes      Yes                                      +---------+---------------+---------+-----------+----------+-------------------+ PFV      Full                                                             +---------+---------------+---------+-----------+----------+-------------------+ POP      Full           Yes      Yes                                      +---------+---------------+---------+-----------+----------+-------------------+ PTV      Full                                                             +---------+---------------+---------+-----------+----------+-------------------+ PERO                                                   Not well visualized +---------+---------------+---------+-----------+----------+-------------------+ GSV      Full                                                             +---------+---------------+---------+-----------+----------+-------------------+  Hematoma noted at palpable knot in proximal thigh at site of bruising. Fluid noted in posterior distal thigh, popliteal fossa, and throughout the left calf. Fluid noted at site of posterior thrombectomy incision where bruising is noted    Summary: RIGHT: - There is no evidence of deep vein thrombosis in the lower extremity.  - No cystic structure found in the popliteal fossa.  LEFT: - There is no evidence of deep vein thrombosis in the lower extremity.  - No cystic structure found in the popliteal fossa. - Hematoma noted at palpable knot in proximal thigh at site of bruising. Fluid noted in posterior distal thigh, popliteal fossa, and throughout the left calf. Fluid noted at site of posterior thrombectomy incision where bruising is noted  *See table(s) above for measurements and observations.    Preliminary      Procedures   Medications Ordered in the ED  oxyCODONE -acetaminophen  (PERCOCET/ROXICET) 5-325 MG per tablet 1 tablet (1 tablet Oral Given 03/30/24 1250)  morphine  (PF) 4 MG/ML injection 4 mg (4 mg Intramuscular Given 03/30/24 1424)                                    Medical Decision Making Risk Prescription drug management.   This patient presents to the ED for concern of leg pain, this involves an extensive number of treatment options, and is a complaint that carries with it a high risk of complications and morbidity.   Differential diagnosis includes: Acute DVT, hematoma, superficial phlebitis, etc.   Comorbidities  See HPI above   Additional History  Additional history obtained from prior ED notes and procedure note.   Lab Tests  I ordered and personally interpreted labs.  The  pertinent results include:   Elevated WBC 11.2 otherwise CBC is reassuring Elevated BUN 24, otherwise CMP is within normal limits.   PT/INR slightly elevated.   Imaging Studies  I ordered imaging studies including bilateral DVT studies  I independently visualized and interpreted imaging which showed:  No DVT in bilateral lower extremities.  In the left lower extremity there is a hematoma at the proximal thigh.  Fluid at the posterior distal thigh, popliteal fossa, and throughout left calf - more extensive than versus regular fluid collection noted at incision site.   Cardiovascular sonographer called me to inform me of these findings.  She is unsure what be causing the extensive fluid collection in his leg.  Will consult vascular surgery. I agree with the radiologist interpretation   Consultations  I requested consultation with Dr. Pearline with vascular surgery,  and discussed lab and imaging findings as well as pertinent plan - they recommend:  Could have hematoma with fluid formation and leakage - should wear compression stockings.   Problem List / ED Course / Critical Interventions / Medication Management  Patient reports pain to the left lower extremity, worsening in the past 2 days after his thrombectomy on 6/23.  Been taking oxycodone  with some improvement.  Also having some tingling of the right lower extremity is concerned about clot there as well.  Denies associated swelling or tenderness. No chest pain, shortness of breath, or dizziness.  Has been taking Eliquis  as prescribed since the procedure. Morphine  given for pain, he was given oxycodone  with triage without improvement. States he did not drive here today. Patient instructed to wear compression stockings as directed by vascular surgery.  Patient states he did not yesterday and has  not put back on since.  He is concerned about his pain since he ran out of his oxycodone  this morning.  Sent a prescription for several days worth  of medication to the pharmacy.  Advised to follow-up with vascular surgery outpatient for reevaluation.   Social Determinants of Health  Access to healthcare   Test / Admission - Considered  Patient is stable and safe for discharge home. Return precautions given.    Final diagnoses:  Left leg pain    ED Discharge Orders          Ordered    oxyCODONE  10 MG TABS  Every 6 hours PRN        03/30/24 1445               Waddell Sluder, PA-C 03/30/24 1504    Freddi Hamilton, MD 03/30/24 [phone]

## 2024-03-30 NOTE — Progress Notes (Signed)
 VASCULAR LAB    Bilateral lower extremity venous duplex has been performed.  See CV proc for preliminary results.  Called results to [facility], PA-C  Lovie Zarling, RVT 03/30/2024, 2:08 PM

## 2024-03-30 NOTE — Discharge Instructions (Addendum)
 As discussed, your labs and imaging are reassuring. Per vascular surgery, you have fluid in the back of the leg which could be due to hematoma formation and leakage - wear compression stocking to help with pain and swelling. I have sent a prescription of more oxycodone  to the pharmacy to take every 6 hours as needed. Make sure you keep taking Eliquis  as prescribed.  Follow up with vascular for reevaluation.  Return to the ED if symptoms worsen in the interim.

## 2024-03-30 NOTE — ED Provider Triage Note (Addendum)
 Emergency Medicine Provider Triage Evaluation Note  Ernest Day , a 73 y.o. male  was evaluated in triage.  Pt complains of left leg pain.  He is status post thrombectomy by Dr. Sheree on Monday.  Reports pain never really improved, he has been taken oxycodone  for pain control without much improvement in symptoms, last dose at 6 AM this morning.  Endorsing more pain to the left calf, left thigh which has a palpable knot.  Also reporting numbness to the right foot and toes.  Is concerned that they never ultrasound the right leg.  No shortness of breath.  He is anticoagulated on Eliquis  and aspirin .  Review of Systems  Positive: Left leg pain Negative: Shortness of breath, chest pain  Physical Exam  BP 110/70   Pulse 92   Temp 98.1 F (36.7 C)   Resp 18   Ht 5' 9.5 (1.765 m)   Wt 94.3 kg   SpO2 99%   BMI 30.28 kg/m  Gen:   Awake, no distress   Resp:  Normal effort  MSK:   Moves extremities without difficulty  Other:  Thickened left-sided calf tenderness with some swelling.  Palpable knot at the left thigh.  Medical Decision Making  Medically screening exam initiated at 11:50 AM.  Appropriate orders placed.  Ernest Day was informed that the remainder of the evaluation will be completed by another provider, this initial triage assessment does not replace that evaluation, and the importance of remaining in the ED until their evaluation is complete.     Ernest Deprey, PA-C 03/30/24 1157    Ernest Bandel, PA-C 03/30/24 1157

## 2024-04-02 ENCOUNTER — Other Ambulatory Visit (HOSPITAL_COMMUNITY): Payer: Self-pay

## 2024-04-02 MED ORDER — LISINOPRIL-HYDROCHLOROTHIAZIDE 10-12.5 MG PO TABS
1.0000 | ORAL_TABLET | Freq: Every day | ORAL | 1 refills | Status: DC
  Filled 2024-04-02: qty 90, 90d supply, fill #0

## 2024-04-04 ENCOUNTER — Other Ambulatory Visit: Payer: Self-pay

## 2024-04-04 ENCOUNTER — Emergency Department (HOSPITAL_COMMUNITY)
Admission: EM | Admit: 2024-04-04 | Discharge: 2024-04-04 | Disposition: A | Discharge status: Home / Self Care | Attending: Emergency Medicine | Admitting: Emergency Medicine

## 2024-04-04 ENCOUNTER — Encounter (HOSPITAL_COMMUNITY): Payer: Self-pay

## 2024-04-04 DIAGNOSIS — Z7901 Long term (current) use of anticoagulants: Secondary | ICD-10-CM | POA: Insufficient documentation

## 2024-04-04 DIAGNOSIS — M79605 Pain in left leg: Secondary | ICD-10-CM | POA: Insufficient documentation

## 2024-04-04 DIAGNOSIS — Z7982 Long term (current) use of aspirin: Secondary | ICD-10-CM | POA: Insufficient documentation

## 2024-04-04 LAB — CBC WITH DIFFERENTIAL/PLATELET
Abs Immature Granulocytes: 0.04 K/uL (ref 0.00–0.07)
Basophils Absolute: 0.1 K/uL (ref 0.0–0.1)
Basophils Relative: 1 %
Eosinophils Absolute: 0.1 K/uL (ref 0.0–0.5)
Eosinophils Relative: 1 %
HCT: 39.7 % (ref 39.0–52.0)
Hemoglobin: 13.5 g/dL (ref 13.0–17.0)
Immature Granulocytes: 0 %
Lymphocytes Relative: 26 %
Lymphs Abs: 2.6 K/uL (ref 0.7–4.0)
MCH: 32.4 pg (ref 26.0–34.0)
MCHC: 34 g/dL (ref 30.0–36.0)
MCV: 95.2 fL (ref 80.0–100.0)
Monocytes Absolute: 0.8 K/uL (ref 0.1–1.0)
Monocytes Relative: 8 %
Neutro Abs: 6.6 K/uL (ref 1.7–7.7)
Neutrophils Relative %: 64 %
Platelets: 558 K/uL — ABNORMAL HIGH (ref 150–400)
RBC: 4.17 MIL/uL — ABNORMAL LOW (ref 4.22–5.81)
RDW: 12.2 % (ref 11.5–15.5)
WBC: 10.2 K/uL (ref 4.0–10.5)
nRBC: 0 % (ref 0.0–0.2)

## 2024-04-04 LAB — COMPREHENSIVE METABOLIC PANEL WITH GFR
ALT: 19 U/L (ref 0–44)
AST: 24 U/L (ref 15–41)
Albumin: 4.2 g/dL (ref 3.5–5.0)
Alkaline Phosphatase: 89 U/L (ref 38–126)
Anion gap: 14 (ref 5–15)
BUN: 18 mg/dL (ref 8–23)
CO2: 21 mmol/L — ABNORMAL LOW (ref 22–32)
Calcium: 9.7 mg/dL (ref 8.9–10.3)
Chloride: 103 mmol/L (ref 98–111)
Creatinine, Ser: 0.99 mg/dL (ref 0.61–1.24)
GFR, Estimated: >60 mL/min (ref >60)
Glucose, Bld: 126 mg/dL — ABNORMAL HIGH (ref 70–99)
Potassium: 4.5 mmol/L (ref 3.5–5.1)
Sodium: 138 mmol/L (ref 135–145)
Total Bilirubin: 1.2 mg/dL (ref 0.0–1.2)
Total Protein: 8.3 g/dL — ABNORMAL HIGH (ref 6.5–8.1)

## 2024-04-04 LAB — I-STAT CG4 LACTIC ACID, ED: Lactic Acid, Venous: 2.7 mmol/L (ref 0.5–1.9)

## 2024-04-04 MED ORDER — HYDROMORPHONE HCL 1 MG/ML IJ SOLN
2.0000 mg | Freq: Once | INTRAMUSCULAR | Status: AC
  Administered 2024-04-04: 2 mg via INTRAMUSCULAR
  Filled 2024-04-04: qty 2

## 2024-04-04 MED ORDER — OXYCODONE HCL 10 MG PO TABS: 10.0000 mg | ORAL_TABLET | Freq: Four times a day (QID) | ORAL | 0 refills | Status: DC | PRN

## 2024-04-04 MED ORDER — OXYCODONE-ACETAMINOPHEN 5-325 MG PO TABS
1.0000 | ORAL_TABLET | ORAL | Status: DC | PRN
  Administered 2024-04-04: 1 via ORAL
  Filled 2024-04-04: qty 1

## 2024-04-04 NOTE — ED Provider Notes (Signed)
 [facility] Provider Note   CSN: [id number] Arrival date & time: 04/04/24  9065     Patient presents with: Leg Pain   Ernest Day is a 73 y.o. male here with left leg pain.  Hx of extensive left leg DVT with thrombectomy recently, now on eliquis  and aspirin .  See below for discharge summary from vascular service last month.  Pt reporting worsening pain, incision site red and warm.  He finished his oxycodone  10 mg yesterday, has an appointment in a few weeks with Vascular surgery, and states he tried calling their office today but was hung up on.  He is frustrated because he was told his pain would improve after thrombectomy but he says it's as worse as ever.  03/26/24 discharge summary Vas Surgery service: He underwent Mechanical thrombectomy (on 6/23) with Inari IVC, left common iliac vein, left external iliac vein, left common femoral vein, left femoral vein, patent left popliteal vein with stenting of the left common iliac vein by Dr. Sheree on 03/24/2024.  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  He tolerated procedure well and was kept hospitalized postoperatively.  POD #1 pain had difficulty with postoperative pain.  He was weaned from IV narcotic pain medication.  He was transition from heparin  back to Eliquis .  He was admitted for a thigh-high 20 to 30 mmHg compression sock which was applied.  POD #2 he is ready for discharge home.  He will also be started on 81 mg aspirin  daily due to presence of iliac venous stents.  He will require 3 months of Eliquis  at which time he can take aspirin  alone.  He was prescribed #20 10 mg oxycodones and made aware this will be his last prescription for pain medication.  He will follow-up in the office in 4 to 6 weeks to have an IVC/iliac venous duplex.    HPI     Prior to Admission medications   Medication Sig Start Date End Date Taking? Authorizing Provider   oxyCODONE  10 MG TABS Take 1 tablet (10 mg total) by mouth every 6 (six) hours as needed for up to 24 doses for severe pain (pain score 7-10). 04/04/24  Yes Aerica Rincon, Donnice PARAS, MD  apixaban  (ELIQUIS ) 5 MG TABS tablet Take 5 mg by mouth 2 (two) times daily. 03/19/24 06/23/24  [provider]  aspirin  EC 81 MG tablet Take 1 tablet (81 mg total) by mouth daily. Swallow whole. 03/26/24   Bethanie Donnice, PA-C  lisinopril -hydrochlorothiazide  (ZESTORETIC ) 10-12.5 MG tablet Take 1 tablet by mouth daily. 12/22/22     lisinopril -hydrochlorothiazide  (ZESTORETIC ) 10-12.5 MG tablet Take 1 tablet by mouth daily. 04/02/24     oxyCODONE  10 MG TABS Take 1 tablet (10 mg total) by mouth every 6 (six) hours as needed for up to 5 days for severe pain (pain score 7-10). 03/30/24 04/04/24  Waddell Sluder, PA-C  Semaglutide -Weight Management (WEGOVY ) 0.25 MG/0.5ML SOAJ Inject 0.25 mg into the skin once a week. Patient not taking: Reported on 03/22/2024 02/19/23       Allergies: Patient has no known allergies.    Review of Systems  Updated Vital Signs BP (!) 170/79 (BP Location: Left Arm)   Pulse 86   Temp 97.9 F (36.6 C)   Resp 17   SpO2 100%   Physical Exam Constitutional:      General: He is not in acute distress. HENT:     Head: Normocephalic and atraumatic.  Eyes:     Conjunctiva/sclera: Conjunctivae normal.     Pupils: Pupils are equal, round, and reactive to light.  Cardiovascular:     Rate and Rhythm: Normal rate and regular rhythm.     Pulses: Normal pulses.     Comments: Brisk pedal pulse Pulmonary:     Effort: Pulmonary effort is normal. No respiratory distress.  Abdominal:     General: There is no distension.     Tenderness: There is no abdominal tenderness.  Skin:    General: Skin is warm and dry.     Comments: No notable swelling of left lower leg, incision site behind left knee appears clean, without drainage  Neurological:     General: No focal deficit present.     Mental Status: He  is alert. Mental status is at baseline.  Psychiatric:        Mood and Affect: Mood normal.        Behavior: Behavior normal.     (all labs ordered are listed, but only abnormal results are displayed) Labs Reviewed  COMPREHENSIVE METABOLIC PANEL WITH GFR - Abnormal; Notable for the following components:      Result Value   CO2 21 (*)    Glucose, Bld 126 (*)    Total Protein 8.3 (*)    All other components within normal limits  CBC WITH DIFFERENTIAL/PLATELET - Abnormal; Notable for the following components:   RBC 4.17 (*)    Platelets 558 (*)    All other components within normal limits  I-STAT CG4 LACTIC ACID, ED - Abnormal; Notable for the following components:   Lactic Acid, Venous 2.7 (*)    All other components within normal limits    EKG: None  Radiology: No results found.   Procedures   Medications Ordered in the ED  oxyCODONE -acetaminophen  (PERCOCET/ROXICET) 5-325 MG per tablet 1 tablet (1 tablet Oral Given 04/04/24 1019)  HYDROmorphone  (DILAUDID ) injection 2 mg (2 mg Intramuscular Given 04/04/24 1135)                                    Medical Decision Making Amount and/or Complexity of Data Reviewed Labs: ordered.  Risk Prescription drug management.   Pt here for ongoing left leg pain after thrombectomy 10 days ago, compliant with eliquis  and aspirin   Neurovascularly intact, brisk pedal pulse, distal foot appears well perfused  I do not see evidence of infection on exam  Labs ordered in triage. I reviewed these.  WBC normal.  Lactate mildly elevated, likely multifactorial, but I doubt necrosis or sepsis as a cause.  Doubt compartment syndrome - soft compartments  I reviewed external records including discharge summary June 2025, repeat vas ultrasound 6/29, which noted hematoma in proximal thigh, fluid throughout left calf, no persistent DVT.   I don't think he needs a repeat scan today  IM dilaudid  given for pain in ED  I spoke to vascular surgeon  Dr Gretta who advised - refill pain meds as needed, they will have expedited office follow up next week; given recent normal vascular ultrasound 5 days ago (without evidence of re-thrombosis), no indication to repeat imaging at this time  Patient updated and in agreement with plan  Patient reports he is wearing compression stocking at home already     Final diagnoses:  Left leg pain    ED Discharge Orders          Ordered  oxyCODONE  10 MG TABS  Every 6 hours PRN        04/04/24 1220               Cottie Donnice PARAS, MD 04/04/24 1220

## 2024-04-04 NOTE — ED Triage Notes (Signed)
 Pt c.o severe left leg pain, recent venous surgery where they removed multiple clots. Pt was seen here on 6/29 for the same and discharged with pain pills. Incision site is red and leg slightly more swollen than the right.

## 2024-04-04 NOTE — Discharge Instructions (Signed)
 Keep your left leg elevated and compressed (ie. With stocking or bandage wrapping) as much as possible.  You should flex, walk, and gently message your muscles several times a day to keep your circulation flowing through the leg.

## 2024-04-09 ENCOUNTER — Other Ambulatory Visit: Payer: Self-pay | Admitting: *Deleted

## 2024-04-09 DIAGNOSIS — M79606 Pain in leg, unspecified: Secondary | ICD-10-CM

## 2024-04-09 NOTE — Progress Notes (Signed)
 HISTORY AND PHYSICAL     CC:  follow up. Requesting Provider:  No ref. provider found  HPI: This is a 73 y.o. 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  Pt has hx of mechanical thrombectomy of left CIV, EIV, CFV, FV, and popliteal vein and stent of left CIV on 03/24/2024 by Dr. Sheree.   He was placed in thigh high compression stockings with plan for 3 months of Eliquis .  He was scheduled for 4-6 week IVC/iliac duplex.   He was seen in the ED on 04/04/2024 with c/o pain and was frustrated as he was told his pain would improve but was worse.  He did have a DVT study on 03/22/2024 revealing acute DVT.  He did have another DVT study on 03/31/2024 that revealed hematoma in proximal thigh and fluid noted in posterior distal thigh, popliteal fossa, and throughout the left calf.  Fluid was noted at site of posterior thrombectomy incision site.   The pt returns today for follow up.  He states that his pain is 10 out of 10 in his left lower leg.  He states that he cannot walk 20 yards.  He has been taking pain medicine he received from the ER, which helps mask the pain.  He states that it is the same as when he walked out of the hospital.  He states he went to his brothers shop but could not stay due to the pain.  He has been wearing his compression sock and he has been elevating his leg.   He is very frustrated.    The pt is not on a statin for cholesterol management.    The pt is on an aspirin .    Other AC:  Eliquis  The pt is on ACEI/diuretic for hypertension.  The pt is on medication for diabetes. Tobacco hx:  never    Past Medical History:  Diagnosis Date   Hypertension     Past Surgical History:  Procedure Laterality Date   CHOLECYSTECTOMY N/A 01/04/2023   Procedure: LAPAROSCOPIC CHOLECYSTECTOMY;  Surgeon: Vanderbilt Ned, MD;  Location: [facility] OR;  Service: General;  Laterality: N/A;   KNEE SURGERY Right    meniscus repair   LOWER EXTREMITY INTERVENTION Left  03/24/2024   Procedure: LOWER EXTREMITY INTERVENTION;  Surgeon: Sheree Penne Bruckner, MD;  Location: [facility] INVASIVE CV LAB;  Service: Cardiovascular;  Laterality: Left;   LOWER EXTREMITY VENOGRAPHY Left 03/24/2024   Procedure: LOWER EXTREMITY VENOGRAPHY;  Surgeon: Sheree Penne Bruckner, MD;  Location: [facility] INVASIVE CV LAB;  Service: Cardiovascular;  Laterality: Left;    No Known Allergies  Current Outpatient Medications  Medication Sig Dispense Refill   apixaban  (ELIQUIS ) 5 MG TABS tablet Take 5 mg by mouth 2 (two) times daily.     aspirin  EC 81 MG tablet Take 1 tablet (81 mg total) by mouth daily. Swallow whole. 30 tablet 12   lisinopril -hydrochlorothiazide  (ZESTORETIC ) 10-12.5 MG tablet Take 1 tablet by mouth daily. 90 tablet 3   lisinopril -hydrochlorothiazide  (ZESTORETIC ) 10-12.5 MG tablet Take 1 tablet by mouth daily. 90 tablet 1   oxyCODONE  10 MG TABS Take 1 tablet (10 mg total) by mouth every 6 (six) hours as needed for up to 24 doses for severe pain (pain score 7-10). 24 tablet 0   Semaglutide -Weight Management (WEGOVY ) 0.25 MG/0.5ML SOAJ Inject 0.25 mg into the skin once a week. (Patient not taking: Reported on 03/22/2024) 2 mL 1   No current facility-administered medications for this  visit.    No family history on file.  Social History   Socioeconomic History   Marital status: Married    Spouse name: Not on file   Number of children: Not on file   Years of education: Not on file   Highest education level: Not on file  Occupational History   Not on file  Tobacco Use   Smoking status: Never   Smokeless tobacco: Never  Vaping Use   Vaping status: Never Used  Substance and Sexual Activity   Alcohol use: No    Comment: Denies   Drug use: No    Comment: Denies   Sexual activity: Not on file  Other Topics Concern   Not on file  Social History Narrative   Not on file   Social Drivers of Health   Financial Resource Strain: Not on file  Food Insecurity: No Food  Insecurity (03/22/2024)   Hunger Vital Sign    Worried About Running Out of Food in the Last Year: Never true    Ran Out of Food in the Last Year: Never true  Transportation Needs: No Transportation Needs (03/22/2024)   PRAPARE - Administrator, Civil Service (Medical): No    Lack of Transportation (Non-Medical): No  Physical Activity: Not on file  Stress: No Stress Concern Present (03/17/2024)   Received from [facility] - Occupational Stress Questionnaire    Feeling of Stress : Not at all  Social Connections: Patient Declined (03/22/2024)   Social Connection and Isolation Panel    Frequency of Communication with Friends and Family: Patient declined    Frequency of Social Gatherings with Friends and Family: Patient declined    Attends Religious Services: Patient declined    Database administrator or Organizations: Patient declined    Attends Banker Meetings: Patient declined    Marital Status: Patient declined  Intimate Partner Violence: Not At Risk (03/22/2024)   Humiliation, Afraid, Rape, and Kick questionnaire    Fear of Current or Ex-Partner: No    Emotionally Abused: No    Physically Abused: No    Sexually Abused: No     REVIEW OF SYSTEMS:   [X]  denotes positive finding, [ ]  denotes negative finding Cardiac  Comments:  Chest pain or chest pressure:    Shortness of breath upon exertion:    Short of breath when lying flat:    Irregular heart rhythm:        Vascular    Pain in calf, thigh, or hip brought on by ambulation: x   Pain in feet at night that wakes you up from your sleep:     Blood clot in your veins:    Leg swelling:  x Much improved       Pulmonary    Oxygen at home:    Productive cough:     Wheezing:         Neurologic    Sudden weakness in arms or legs:     Sudden numbness in arms or legs:     Sudden onset of difficulty speaking or slurred speech:    Temporary loss of vision in one  eye:     Problems with dizziness:         Gastrointestinal    Blood in stool:     Vomited blood:         Genitourinary    Burning when urinating:     Blood in  urine:        Psychiatric    Major depression:         Hematologic    Bleeding problems:    Problems with blood clotting too easily:        Skin    Rashes or ulcers:        Constitutional    Fever or chills:      PHYSICAL EXAMINATION:  Today's Vitals   04/11/24 0837  BP: (!) 147/95  Pulse: 89  Resp: 18  Temp: 97.8 F (36.6 C)  TempSrc: Temporal  SpO2: 100%  Weight: 190 lb 8 oz (86.4 kg)  Height: 5' 9.5 (1.765 m)  PainSc: 10-Worst pain ever   Body mass index is 27.73 kg/m.   General:  WDWN in NAD; vital signs documented above Gait: Not observed HENT: WNL, normocephalic Pulmonary: normal non-labored breathing , without wheezing Cardiac: regular HR Skin: without rashes Vascular Exam/Pulses: 2+ left femoral and DP pulse Extremities:  Swelling LLE much improved from picture shown by pt pre procedure He does have hematoma that is palpable in the left calf and it is tender however soft.  Musculoskeletal: no muscle wasting or atrophy  Neurologic: A&O X 3 Psychiatric:  The pt has Normal affect.   Non-Invasive Vascular Imaging:   Venous duplex on 04/11/2024: +-------------------+---------+-----------+---------+-----------+--------+         CIV        RT-PatentRT-ThrombusLT-PatentLT-ThrombusComments  +-------------------+---------+-----------+---------+-----------+--------+  Common Iliac Prox                       patent                       +-------------------+---------+-----------+---------+-----------+--------+  Common Iliac Mid                        patent                       +-------------------+---------+-----------+---------+-----------+--------+  Common Iliac Distal                     patent                        +-------------------+---------+-----------+---------+-----------+--------+   +-------------------------+---------+-----------+---------+-----------+----            EIV            RT-PatentRT-ThrombusLT-PatentLT-ThrombusComments  +-------------------------+---------+-----------+---------+-----------+----  External Iliac Vein Prox                      patent                  +-------------------------+---------+-----------+---------+-----------+----   External Iliac Vein Mid                       patent                  +-------------------------+---------+-----------+---------+-----------+----  External Iliac Vein Distal                             patent                               +-------------------------+---------+-----------+---------+-----------+----     Limited evaluation of the left leg demonstrates patent CFV, FV, and popliteal vein. Large  hematoma from the popliteal fossa to the mid/distal calf.      ASSESSMENT/PLAN:: 73 y.o. 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  Pt has hx of mechanical thrombectomy of left CIV, EIV, CFV, FV, and popliteal vein and stent of left CIV on 03/24/2024 by Dr. Sheree.   -pt seen with Dr. Gretta.  No evidence of compartment syndrome.   -he has palpable femoral and DP pulse on the left.  He does have a hematoma in the lower leg.  Given that he continues to have pain, will schedule for urgent CTA with runoff to ensure he does not have a psa.  Dr. Gretta discussed this with the pt.  Discussed that if there is a psa, it may need operative repair.   -I did send Oxycodone  10mg  q6h prn pain #20 no refill to the [facility].  PDMP reviewed.  -continue Eliquis  for 3 months -pt will f/u on Wednesday 7/16 with Dr. Sheree to review CTA results. -pt will continue to wear his compression and elevate leg in the meantime.     Lucie Apt, [facility] [phone]  Clinic MD:    Serene on call MD

## 2024-04-10 ENCOUNTER — Other Ambulatory Visit (HOSPITAL_COMMUNITY): Payer: Self-pay

## 2024-04-11 ENCOUNTER — Ambulatory Visit (HOSPITAL_COMMUNITY)
Admission: RE | Admit: 2024-04-11 | Discharge: 2024-04-11 | Disposition: A | Discharge status: Home / Self Care | Source: Ambulatory Visit | Attending: Physician Assistant | Admitting: Physician Assistant

## 2024-04-11 ENCOUNTER — Ambulatory Visit (INDEPENDENT_AMBULATORY_CARE_PROVIDER_SITE_OTHER): Admitting: Physician Assistant

## 2024-04-11 ENCOUNTER — Other Ambulatory Visit: Payer: Self-pay

## 2024-04-11 ENCOUNTER — Encounter: Payer: Self-pay | Admitting: Physician Assistant

## 2024-04-11 ENCOUNTER — Other Ambulatory Visit (HOSPITAL_COMMUNITY): Payer: Self-pay

## 2024-04-11 VITALS — BP 147/95 | HR 89 | Temp 97.8°F | Resp 18 | Ht 69.5 in | Wt 190.5 lb

## 2024-04-11 DIAGNOSIS — M79606 Pain in leg, unspecified: Secondary | ICD-10-CM | POA: Diagnosis present

## 2024-04-11 DIAGNOSIS — S8012XA Contusion of left lower leg, initial encounter: Secondary | ICD-10-CM

## 2024-04-11 MED ORDER — OXYCODONE HCL 10 MG PO TABS
10.0000 mg | ORAL_TABLET | Freq: Four times a day (QID) | ORAL | 0 refills | Status: DC | PRN
  Filled 2024-04-11: qty 20, 5d supply, fill #0

## 2024-04-11 MED ORDER — OXYCODONE HCL 5 MG PO TABS: 10.0000 mg | ORAL_TABLET | Freq: Four times a day (QID) | ORAL | Status: DC | PRN

## 2024-04-15 ENCOUNTER — Inpatient Hospital Stay
Admission: RE | Admit: 2024-04-15 | Discharge: 2024-04-15 | Discharge status: Home / Self Care | Source: Ambulatory Visit | Attending: Vascular Surgery

## 2024-04-15 DIAGNOSIS — M79606 Pain in leg, unspecified: Secondary | ICD-10-CM

## 2024-04-15 DIAGNOSIS — S8012XA Contusion of left lower leg, initial encounter: Secondary | ICD-10-CM

## 2024-04-15 MED ORDER — IOPAMIDOL (ISOVUE-370) INJECTION 76%
130.0000 mL | Freq: Once | INTRAVENOUS | Status: AC | PRN
  Administered 2024-04-15: 130 mL via INTRAVENOUS

## 2024-04-16 ENCOUNTER — Other Ambulatory Visit (HOSPITAL_COMMUNITY): Payer: Self-pay

## 2024-04-16 ENCOUNTER — Other Ambulatory Visit: Payer: Self-pay | Admitting: Vascular Surgery

## 2024-04-16 ENCOUNTER — Encounter: Payer: Self-pay | Admitting: Vascular Surgery

## 2024-04-16 ENCOUNTER — Ambulatory Visit: Attending: Vascular Surgery | Admitting: Vascular Surgery

## 2024-04-16 VITALS — BP 126/84 | HR 88 | Temp 97.8°F | Ht 69.5 in | Wt 191.8 lb

## 2024-04-16 DIAGNOSIS — I824Y2 Acute embolism and thrombosis of unspecified deep veins of left proximal lower extremity: Secondary | ICD-10-CM | POA: Insufficient documentation

## 2024-04-16 DIAGNOSIS — S8012XA Contusion of left lower leg, initial encounter: Secondary | ICD-10-CM | POA: Insufficient documentation

## 2024-04-16 MED ORDER — OXYCODONE HCL 10 MG PO TABS
10.0000 mg | ORAL_TABLET | Freq: Four times a day (QID) | ORAL | 0 refills | Status: AC | PRN
  Filled 2024-04-16: qty 20, 5d supply, fill #0
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
## 2024-04-25 ENCOUNTER — Other Ambulatory Visit (HOSPITAL_COMMUNITY): Payer: Self-pay

## 2024-04-25 MED ORDER — OXYCODONE-ACETAMINOPHEN 10-325 MG PO TABS
1.0000 | ORAL_TABLET | Freq: Two times a day (BID) | ORAL | 0 refills | Status: DC | PRN
  Filled 2024-04-25: qty 60, 30d supply, fill #0

## 2024-04-25 MED ORDER — LISINOPRIL-HYDROCHLOROTHIAZIDE 10-12.5 MG PO TABS
1.0000 | ORAL_TABLET | Freq: Every day | ORAL | 1 refills | Status: AC
  Filled 2024-04-25: qty 90, 90d supply, fill #0

## 2024-04-25 MED ORDER — NALOXONE HCL 4 MG/0.1ML NA LIQD
4.0000 mg | NASAL | 0 refills | Status: AC | PRN
  Filled 2024-04-25: qty 2, 10d supply, fill #0

## 2024-04-30 ENCOUNTER — Ambulatory Visit: Admitting: Family

## 2024-05-15 ENCOUNTER — Other Ambulatory Visit (HOSPITAL_COMMUNITY): Payer: Self-pay

## 2024-06-03 ENCOUNTER — Other Ambulatory Visit (HOSPITAL_COMMUNITY): Payer: Self-pay

## 2024-06-03 MED ORDER — OXYCODONE-ACETAMINOPHEN 10-325 MG PO TABS
1.0000 | ORAL_TABLET | Freq: Two times a day (BID) | ORAL | 0 refills | Status: DC | PRN
  Filled 2024-06-03: qty 60, 30d supply, fill #0

## 2024-07-03 ENCOUNTER — Other Ambulatory Visit (HOSPITAL_COMMUNITY): Payer: Self-pay

## 2024-07-03 MED ORDER — OXYCODONE-ACETAMINOPHEN 10-325 MG PO TABS
1.0000 | ORAL_TABLET | Freq: Two times a day (BID) | ORAL | 0 refills | Status: DC | PRN
  Filled 2024-07-03: qty 60, 30d supply, fill #0

## 2024-08-19 ENCOUNTER — Other Ambulatory Visit (HOSPITAL_COMMUNITY): Payer: Self-pay

## 2024-08-19 MED ORDER — OXYCODONE-ACETAMINOPHEN 10-325 MG PO TABS
1.0000 | ORAL_TABLET | Freq: Two times a day (BID) | ORAL | 0 refills | Status: AC | PRN
  Filled 2024-08-19: qty 60, 30d supply, fill #0

## 2024-10-15 ENCOUNTER — Emergency Department (HOSPITAL_COMMUNITY)

## 2024-10-15 ENCOUNTER — Encounter (HOSPITAL_COMMUNITY): Payer: Self-pay

## 2024-10-15 ENCOUNTER — Other Ambulatory Visit: Payer: Self-pay

## 2024-10-15 ENCOUNTER — Emergency Department (HOSPITAL_COMMUNITY): Admission: EM | Admit: 2024-10-15 | Discharge: 2024-10-15 | Disposition: A | Discharge status: Home / Self Care

## 2024-10-15 DIAGNOSIS — M5441 Lumbago with sciatica, right side: Secondary | ICD-10-CM | POA: Diagnosis not present

## 2024-10-15 DIAGNOSIS — Z7982 Long term (current) use of aspirin: Secondary | ICD-10-CM | POA: Insufficient documentation

## 2024-10-15 DIAGNOSIS — Z7901 Long term (current) use of anticoagulants: Secondary | ICD-10-CM | POA: Diagnosis not present

## 2024-10-15 DIAGNOSIS — M545 Low back pain, unspecified: Secondary | ICD-10-CM | POA: Diagnosis present

## 2024-10-15 DIAGNOSIS — W0110XA Fall on same level from slipping, tripping and stumbling with subsequent striking against unspecified object, initial encounter: Secondary | ICD-10-CM | POA: Insufficient documentation

## 2024-10-15 LAB — CBC WITH DIFFERENTIAL/PLATELET
Abs Immature Granulocytes: 0.03 K/uL (ref 0.00–0.07)
Basophils Absolute: 0 K/uL (ref 0.0–0.1)
Basophils Relative: 1 %
Eosinophils Absolute: 0.1 K/uL (ref 0.0–0.5)
Eosinophils Relative: 2 %
HCT: 39.7 % (ref 39.0–52.0)
Hemoglobin: 13.9 g/dL (ref 13.0–17.0)
Immature Granulocytes: 0 %
Lymphocytes Relative: 32 %
Lymphs Abs: 2.3 K/uL (ref 0.7–4.0)
MCH: 32.9 pg (ref 26.0–34.0)
MCHC: 35 g/dL (ref 30.0–36.0)
MCV: 94.1 fL (ref 80.0–100.0)
Monocytes Absolute: 0.6 K/uL (ref 0.1–1.0)
Monocytes Relative: 8 %
Neutro Abs: 4.1 K/uL (ref 1.7–7.7)
Neutrophils Relative %: 57 %
Platelets: 252 K/uL (ref 150–400)
RBC: 4.22 MIL/uL (ref 4.22–5.81)
RDW: 11.9 % (ref 11.5–15.5)
WBC: 7.2 K/uL (ref 4.0–10.5)
nRBC: 0 % (ref 0.0–0.2)

## 2024-10-15 LAB — COMPREHENSIVE METABOLIC PANEL WITH GFR
ALT: 50 U/L — ABNORMAL HIGH (ref 0–44)
AST: 42 U/L — ABNORMAL HIGH (ref 15–41)
Albumin: 4 g/dL (ref 3.5–5.0)
Alkaline Phosphatase: 59 U/L (ref 38–126)
Anion gap: 12 (ref 5–15)
BUN: 15 mg/dL (ref 8–23)
CO2: 23 mmol/L (ref 22–32)
Calcium: 9.2 mg/dL (ref 8.9–10.3)
Chloride: 103 mmol/L (ref 98–111)
Creatinine, Ser: 0.97 mg/dL (ref 0.61–1.24)
GFR, Estimated: >60 mL/min
Glucose, Bld: 100 mg/dL — ABNORMAL HIGH (ref 70–99)
Potassium: 4.4 mmol/L (ref 3.5–5.1)
Sodium: 138 mmol/L (ref 135–145)
Total Bilirubin: 0.3 mg/dL (ref 0.0–1.2)
Total Protein: 6.8 g/dL (ref 6.5–8.1)

## 2024-10-15 MED ORDER — LIDOCAINE 5 % EX PTCH: 1.0000 | MEDICATED_PATCH | CUTANEOUS | 0 refills | Status: AC

## 2024-10-15 MED ORDER — OXYCODONE HCL 5 MG PO TABS
5.0000 mg | ORAL_TABLET | Freq: Once | ORAL | Status: AC
  Administered 2024-10-15: 5 mg via ORAL
  Filled 2024-10-15: qty 1

## 2024-10-15 MED ORDER — KETOROLAC TROMETHAMINE 15 MG/ML IJ SOLN
15.0000 mg | Freq: Once | INTRAMUSCULAR | Status: AC
  Administered 2024-10-15: 15 mg via INTRAMUSCULAR
  Filled 2024-10-15: qty 1

## 2024-10-15 MED ORDER — LIDOCAINE 5 % EX PTCH: 1.0000 | MEDICATED_PATCH | Freq: Once | CUTANEOUS | Status: DC

## 2024-10-15 MED ORDER — ACETAMINOPHEN 500 MG PO TABS
1000.0000 mg | ORAL_TABLET | Freq: Once | ORAL | Status: AC
  Administered 2024-10-15: 1000 mg via ORAL
  Filled 2024-10-15: qty 2

## 2024-10-15 MED ORDER — METHOCARBAMOL 500 MG PO TABS: 500.0000 mg | ORAL_TABLET | Freq: Two times a day (BID) | ORAL | 0 refills | Status: AC

## 2024-10-15 NOTE — ED Provider Notes (Addendum)
 " [facility] Provider Note   CSN: [id number] Arrival date & time: 10/15/24  1213     Patient presents with: Fall, Back Pain, and foot numb   Ernest Day is a 74 y.o. male.   Is a 74 year old male present emergency form of low back pain.  Reports fall earlier this morning.  Tripped on shoestring fell backwards on stairs and slid down roughly 6 stairs.  Did not hit his head, no LOC.  Denies pain otherwise.  Pain largely to right low back.  Endorses some tingly sensation down his right leg.   Fall  Back Pain      Prior to Admission medications  Medication Sig Start Date End Date Taking? Authorizing Provider  apixaban  (ELIQUIS ) 5 MG TABS tablet Take 5 mg by mouth 2 (two) times daily. 03/19/24 06/23/24  [provider]  aspirin  EC 81 MG tablet Take 1 tablet (81 mg total) by mouth daily. Swallow whole. 03/26/24   Bethanie Cough, PA-C  lisinopril -hydrochlorothiazide  (ZESTORETIC ) 10-12.5 MG tablet Take 1 tablet by mouth daily. 12/22/22     lisinopril -hydrochlorothiazide  (ZESTORETIC ) 10-12.5 MG tablet Take 1 tablet by mouth daily. 04/25/24     naloxone  (NARCAN ) nasal spray 4 mg/0.1 mL Place 1 spray (4 mg total) into the nose as needed as directed. 04/25/24     Oxycodone  HCl 10 MG TABS Take 1 tablet (10 mg total) by mouth every 6 (six) hours as needed. 04/16/24   Sheree Penne Bruckner, MD  oxyCODONE -acetaminophen  (PERCOCET) 10-325 MG tablet Take 1 tablet by mouth 2 (two) times daily as needed. 08/19/24       Allergies: Patient has no known allergies.    Review of Systems  Musculoskeletal:  Positive for back pain.    Updated Vital Signs BP (!) 174/73 (BP Location: Left Arm)   Pulse 62   Temp 98.2 F (36.8 C) (Oral)   Resp 16   SpO2 100%   Physical Exam Vitals and nursing note reviewed.  Constitutional:      General: He is not in acute distress.    Appearance: He is not toxic-appearing.  HENT:     Head: Normocephalic.      Nose: Nose normal.     Mouth/Throat:     Mouth: Mucous membranes are moist.  Eyes:     Conjunctiva/sclera: Conjunctivae normal.  Cardiovascular:     Rate and Rhythm: Normal rate and regular rhythm.  Pulmonary:     Effort: Pulmonary effort is normal.  Abdominal:     General: Abdomen is flat. There is no distension.  Musculoskeletal:     Comments: Patient ambulated with a normal gait.  Skin:    General: Skin is warm.  Neurological:     Mental Status: He is alert and oriented to person, place, and time.  Psychiatric:        Mood and Affect: Mood normal.        Behavior: Behavior normal.     (all labs ordered are listed, but only abnormal results are displayed) Labs Reviewed  COMPREHENSIVE METABOLIC PANEL WITH GFR - Abnormal; Notable for the following components:      Result Value   Glucose, Bld 100 (*)    AST 42 (*)    ALT 50 (*)    All other components within normal limits  CBC WITH DIFFERENTIAL/PLATELET    EKG: None  Radiology: MR LUMBAR SPINE WO CONTRAST Result Date: 10/15/2024 EXAM: MRI LUMBAR SPINE 10/15/2024 03:56:20 PM TECHNIQUE: Multiplanar  multisequence MRI of the lumbar spine was performed without the administration of intravenous contrast. COMPARISON: CT abdomen and pelvis 01/10/2023. CLINICAL HISTORY: Low back pain, trauma; Fall, right foot numbness. FINDINGS: BONES AND ALIGNMENT: Normal alignment. Normal vertebral body heights. Mildly heterogeneous but marrow signal intensity. No bone marrow edema or evidence of fracture. Trace retrolisthesis at L5-S1. SPINAL CORD: The conus terminates normally. SOFT TISSUES: No paraspinal mass. Subtle edema in the paraspinal musculature adjacent to the L4-L5 facets likely secondary to facet arthrosis. T12-L1: No significant spinal canal or foraminal stenosis. L1-L2: Minimal disc bulge. Left paracentral annular fissure. Mild facet arthrosis. No significant spinal canal or foraminal stenosis. L2-L3: Small disc bulge. Mild lateral  recess narrowing. Mild facet arthrosis and thickening of the ligamentum flavum. No significant spinal canal or foraminal stenosis. L3-L4: Small disc bulge. Moderate facet arthrosis and thickening of the ligamentum flavum. There is mild lateral recess narrowing right greater than left. Mild spinal canal stenosis. Mild foraminal stenosis on the left. L4-L5: Diffuse disc bulge. Broad based left paracentral disc protrusion. Moderate facet arthrosis and thickening of the ligamentum flavum. There is mild spinal canal stenosis left greater than right. Lateral recess narrowing with potential impingement of the traversing left L5 nerve root. Mild foraminal stenosis on the left. L5-S1: Moderate disc height loss and trace retrolisthesis. Diffuse disc bulge. There is a central disc protrusion noted. Lateral recess narrowing with impingement of the traversing S1 nerve roots left greater than right. Moderate facet arthrosis and thickening of the ligamentum flavum. No significant spinal canal stenosis. There is mild bilateral foraminal stenosis left greater than right. IMPRESSION: 1. No evidence of acute traumatic injury. 2. Left lateral recess narrowing at L4-5 with potential impingement of the traversing left L5 nerve root. 3. Lateral recess narrowing at L5-S1 with impingement of the traversing S1 nerve roots, left greater than right, without significant spinal canal stenosis at this level. Electronically signed by: Donnice Mania MD 10/15/2024 04:20 PM EST RP Workstation: [id number]     Procedures   Medications Ordered in the ED  ketorolac  (TORADOL ) 15 MG/ML injection 15 mg (has no administration in time range)  oxyCODONE  (Oxy IR/ROXICODONE ) immediate release tablet 5 mg (has no administration in time range)  acetaminophen  (TYLENOL ) tablet 1,000 mg (has no administration in time range)  lidocaine  (LIDODERM ) 5 % 1 patch (has no administration in time range)    Clinical Course as of 10/15/24 1659  Wed Oct 15, 2024   1637 MR LUMBAR SPINE WO CONTRAST IMPRESSION: 1. No evidence of acute traumatic injury. 2. Left lateral recess narrowing at L4-5 with potential impingement of the traversing left L5 nerve root. 3. Lateral recess narrowing at L5-S1 with impingement of the traversing S1 nerve roots, left greater than right, without significant spinal canal stenosis at this level.   [TY]    Clinical Course User Index [TY] Neysa Caron PARAS, DO                                 Medical Decision Making This is a 74 year old male presenting emergency department for low back pain.  Clemens this early morning.  Did not hit his head no LOC.  Primarily complaining of low back pain.  He is afebrile non-tachycardic.  Ambulatory with a normal gait, does not appear to be in distress.  MRI ordered in triage without acute cord compression, but did show some L4 V nerve root and L5-S1 nerve root impingement.  Per PDMP review does look like he takes oxycodones.  He is asking for Valium 5 mg 3 times a day by name.  Reports it has worked in the past.  I explained my hesitancy given his age and concurrent opioid use that it could lead to delirium or further falls.  Given negative imaging encourage patient to follow-up with his primary doctor who is prescribing him narcotics for further pain control.  Discharged in stable condition.  Amount and/or Complexity of Data Reviewed Radiology:  Decision-making details documented in ED Course.  Risk Prescription drug management.        Final diagnoses:  Acute right-sided low back pain with right-sided sciatica    ED Discharge Orders     None          Neysa Caron PARAS, DO 10/15/24 1658    Neysa Caron PARAS, DO 10/15/24 1659  "

## 2024-10-15 NOTE — ED Provider Triage Note (Signed)
 Emergency Medicine Provider Triage Evaluation Note  Ernest Day , a 74 y.o. male  was evaluated in triage.  Pt complains of low back pain after a fall last evening.  States he tripped on shoestrings and fell down 6 stairs.  Landed on low back.  States having increasing low back pain and numbness in the right foot.  History of back pain but this is worse than usual.  Denies any head losing consciousness.  No blood thinners.  Review of Systems  Positive: Back pain, numbness Negative: Incontinence, saddle anesthesias, headache, dizziness  Physical Exam  BP (!) 153/80   Pulse 66   Temp 98.1 F (36.7 C)   Resp 18   SpO2 99%  Gen:   Awake, no distress   Resp:  Normal effort  MSK:   Moves extremities without difficulty, ambulatory with a slight limp.  PT pulses 2+ bilaterally Other:    Medical Decision Making  Medically screening exam initiated at 12:44 PM.  Appropriate orders placed.  Ernest Day was informed that the remainder of the evaluation will be completed by another provider, this initial triage assessment does not replace that evaluation, and the importance of remaining in the ED until their evaluation is complete.     Neysa Thersia RAMAN, PA-C 10/15/24 1245

## 2024-10-15 NOTE — ED Triage Notes (Addendum)
 Pt. Stated, I tripped on my shoestring  an went down about 6 steps. My back hurts, and my right foot feels like its numb and pulsating. This happened about 10 hours ago.

## 2024-10-15 NOTE — ED Notes (Signed)
 Pt refused vitals

## 2024-10-15 NOTE — Discharge Instructions (Signed)
 Please follow-up with your primary doctor.  As discussed take Tylenol  turning with ibuprofen for baseline pain control you may use the lidocaine  patches for further pain relief.  Take the Robaxin  for muscle spasms.  Please use caution as these can cause delirium or further falls.  Return for severe headache, vision loss, facial droop, chest pain, shortness of breath, numbness in your genital area, weakness in your lower extremities, difficulty urinating, bowel or bladder incontinence or any new or worsening symptoms that are concerning to you.

## 2024-10-15 NOTE — ED Notes (Signed)
 Pt transported from MRI to 50

## 2024-10-28 ENCOUNTER — Other Ambulatory Visit: Payer: Self-pay

## 2024-10-28 DIAGNOSIS — I824Y2 Acute embolism and thrombosis of unspecified deep veins of left proximal lower extremity: Secondary | ICD-10-CM

## 2024-11-05 ENCOUNTER — Other Ambulatory Visit (HOSPITAL_COMMUNITY): Payer: Self-pay

## 2024-11-05 MED ORDER — LISINOPRIL-HYDROCHLOROTHIAZIDE 10-12.5 MG PO TABS
1.0000 | ORAL_TABLET | Freq: Every day | ORAL | 1 refills | Status: AC
  Filled 2024-11-05: qty 90, 90d supply, fill #0

## 2024-11-05 MED ORDER — OXYCODONE HCL 10 MG PO TABS
10.0000 mg | ORAL_TABLET | Freq: Three times a day (TID) | ORAL | 0 refills | Status: AC | PRN
  Filled 2024-11-05 (×2): qty 90, 30d supply, fill #0

## 2024-11-19 ENCOUNTER — Ambulatory Visit

## 2024-11-19 ENCOUNTER — Ambulatory Visit (HOSPITAL_COMMUNITY)
